# Patient Record
Sex: Female | Born: 1985 | Race: Asian | Hispanic: No | Marital: Married | Smoking: Never smoker
Health system: Southern US, Community
[De-identification: ages and names within clinical notes are randomized; demographics above are authoritative.]

## PROBLEM LIST (undated history)

## (undated) DIAGNOSIS — E119 Type 2 diabetes mellitus without complications: Secondary | ICD-10-CM

## (undated) DIAGNOSIS — R000 Tachycardia, unspecified: Secondary | ICD-10-CM

## (undated) HISTORY — PX: DILATION AND CURETTAGE OF UTERUS: SHX78

## (undated) HISTORY — DX: Tachycardia, unspecified: R00.0

---

## 2019-07-18 NOTE — Progress Notes (Signed)
Cardiology Office Note:    Date:  07/19/2019   ID:  Brittney Andrade, DOB 11-12-1985, MRN [id number]  PCP:  System, Pcp Not In  Cardiologist:  No primary care provider on file.  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  She has had 3 pregnancies.   During 3rd pregnancy in 07/2017 was feeling short of breath and had wheezing and LE edema.  Reports BNP elevated in clinic.  TTE was done which per her report showed normal EF.  She reports that her swelling resolved 2-3 months after delivery.  Took lasix 2-3 weeks after delivery.  Denies any chest pain, shortness of breath, or LE edema currently.  Went to ED 1 week post-partum with HR 150.  Work-up unremarkable.  She reports 3 weeks ago she was lying down and did not note any symptoms, but her watch recorded HR up to 151 bpm.    Past Medical History:  Diagnosis Date  . Tachycardia      Current Medications: Current Meds  Medication Sig  . Docusate Calcium (STOOL SOFTENER PO) Take 1 capsule by mouth daily as needed.  . Prenatal Vit-Fe Fumarate-FA (PRENATAL MULTIVITAMIN) TABS tablet Take 1 tablet by mouth daily at 12 noon.     Allergies:   Patient has no known allergies.   Social History   Socioeconomic History  . Marital status: Unknown    Spouse name: Not on file  . Number of children: Not on file  . Years of education: Not on file  . Highest education level: Not on file  Occupational History  . Not on file  Social Needs  . Financial resource strain: Not on file  . Food insecurity    Worry: Not on file    Inability: Not on file  . Transportation needs    Medical: Not on file    Non-medical: Not on file  Tobacco Use  . Smoking status: Not on file  Substance and Sexual Activity  . Alcohol use: Not on  file  . Drug use: Not on file  . Sexual activity: Not on file  Lifestyle  . Physical activity    Days per week: Not on file    Minutes per session: Not on file  . Stress: Not on file  Relationships  . Social Herbalist on phone: Not on file    Gets together: Not on file    Attends religious service: Not on file    Active member of club or organization: Not on file    Attends meetings of clubs or organizations: Not on file    Relationship status: Not on file  Other Topics Concern  . Not on file  Social History Narrative  . Not on file   3 pregnancies, 1 miscarriage  Family History: Mother: CAD, 5 stents Daughter: ?ASD  ROS:   Please see the history of present illness.    All other systems reviewed and are negative.  EKGs/Labs/Other Studies Reviewed:    The following studies were reviewed today:   EKG:  EKG is ordered today.  The ekg ordered today demonstrates NSR, rate 86 bpm, QTc 411, diffuse T wave flattening  Recent Labs: No results found for requested labs within last 8760 hours.  Recent Lipid Panel No  results found for: CHOL, TRIG, HDL, CHOLHDL, VLDL, LDLCALC, LDLDIRECT  Physical Exam:    VS:  BP 126/70   Pulse 86   Temp 98.6 F (37 C) (Temporal)   Ht 5\' 3"  (1.6 m)   Wt 191 lb 3.2 oz (86.7 kg)   SpO2 98%   BMI 33.87 kg/m     Wt Readings from Last 3 Encounters:  07/19/19 191 lb 3.2 oz (86.7 kg)     GEN:  Well nourished, well developed in no acute distress HEENT: Normal NECK: No JVD; No carotid bruits LYMPHATICS: No lymphadenopathy CARDIAC: RRR, no murmurs, rubs, gallops RESPIRATORY:  Clear to auscultation without rales, wheezing or rhonchi  ABDOMEN: Soft, non-tender, non-distended MUSCULOSKELETAL:  No edema; No deformity  SKIN: Warm and dry NEUROLOGIC:  Alert and oriented x 3 PSYCHIATRIC:  Normal affect   ASSESSMENT:    1. Peripartum cardiomyopathy   2. Tachycardia    PLAN:    In order of problems listed above:  Possible  peripartum cardiomyopathy: does not sound like this was actually diagnosed, as she reported dyspnea/edema during last pregnancy but reportedly TTE was normal.  Will obtain records from [facility] in [place], [place].  If normal TTE, then is not at increased risk with subsequent pregnancy.  She has no current symptoms to suggest heart failure, and declines repeating a TTE at this time.  Tachycardia: watch has twice recorded heart rates above 150, once in 2018 and again 3 weeks ago.  She was asymptomatic.  Given long interval between events, unlikely to capture on a monitor.  If having increased frequency then will evaluate with cardiac monitor   Medication Adjustments/Labs and Tests Ordered: Current medicines are reviewed at length with the patient today.  Concerns regarding medicines are outlined above.  No orders of the defined types were placed in this encounter.  No orders of the defined types were placed in this encounter.   Patient Instructions  Medication Instructions:  The current medical regimen is effective;  continue present plan and medications.  If you need a refill on your cardiac medications before your next appointment, please call your pharmacy.   Follow-Up: At [facility], you and your health needs are our priority.  As part of our continuing mission to provide you with exceptional heart care, we have created designated Provider Care Teams.  These Care Teams include your primary Cardiologist (physician) and Advanced Practice Providers (APPs -  Physician Assistants and Nurse Practitioners) who all work together to provide you with the care you need, when you need it. . Follow up with Dr.Rosamae Rocque as needed.  Any Other Special Instructions Will Be Listed Below (If Applicable). We will let you know if any changes once we receive records.      Signed, Brittney Ishikawahristopher L [name], MD  07/19/2019 2:49 PM    [facility]

## 2019-07-19 ENCOUNTER — Encounter: Payer: Self-pay | Admitting: Cardiology

## 2019-07-19 ENCOUNTER — Ambulatory Visit (INDEPENDENT_AMBULATORY_CARE_PROVIDER_SITE_OTHER): Payer: BC Managed Care – PPO | Admitting: Cardiology

## 2019-07-19 ENCOUNTER — Other Ambulatory Visit: Payer: Self-pay

## 2019-07-19 VITALS — BP 126/70 | HR 86 | Temp 98.6°F | Ht 63.0 in | Wt 191.2 lb

## 2019-07-19 DIAGNOSIS — O903 Peripartum cardiomyopathy: Secondary | ICD-10-CM

## 2019-07-19 DIAGNOSIS — R000 Tachycardia, unspecified: Secondary | ICD-10-CM

## 2019-07-19 NOTE — Patient Instructions (Signed)
Medication Instructions:  The current medical regimen is effective;  continue present plan and medications.  If you need a refill on your cardiac medications before your next appointment, please call your pharmacy.   Follow-Up: At [facility], you and your health needs are our priority.  As part of our continuing mission to provide you with exceptional heart care, we have created designated Provider Care Teams.  These Care Teams include your primary Cardiologist (physician) and Advanced Practice Providers (APPs -  Physician Assistants and Nurse Practitioners) who all work together to provide you with the care you need, when you need it. . Follow up with Dr.Schumann as needed.  Any Other Special Instructions Will Be Listed Below (If Applicable). We will let you know if any changes once we receive records.

## 2019-07-21 ENCOUNTER — Other Ambulatory Visit (INDEPENDENT_AMBULATORY_CARE_PROVIDER_SITE_OTHER): Payer: BC Managed Care – PPO

## 2019-07-21 DIAGNOSIS — R000 Tachycardia, unspecified: Secondary | ICD-10-CM | POA: Diagnosis not present

## 2019-10-24 ENCOUNTER — Telehealth: Payer: Self-pay | Admitting: Cardiology

## 2019-10-24 NOTE — Telephone Encounter (Signed)
New message    Call from patients OB/GYN following up on behalf of the patient who is currently pregnant.  According to 09/09 ov note [facility] was to obtain notes from [facility] in [place], [place]. Have the notes been obtained

## 2019-10-31 NOTE — Telephone Encounter (Signed)
[facility] was calling on behalf of the patient to follow up on a records request. She requested that her [facility] send records to Dr. Gardiner Rhyme. [place] states the patient filled out a record request at her appt in September.

## 2019-11-01 NOTE — Telephone Encounter (Signed)
Patient calling to follow up on the records request.

## 2019-11-10 NOTE — L&D Delivery Note (Signed)
Delivery Note She progressed to complete, AROM forebag with possible light meconium.  Due to rapid labor, no pain meds.  She initially did not push well due to pain, but then she suddenly pushed very well.  At 8:05 AM a viable female was delivered via Vaginal, Spontaneous (Presentation: Middle Occiput Anterior).  APGAR: 7, 8; weight pending.   Placenta status: Spontaneous, Intact.  Cord: 3 vessels with the following complications: None.  NICU team called due to baby retracting, started on CPAP and will go to NICU  Anesthesia: None Episiotomy: None Lacerations: 1st degree Suture Repair: 3.0 Monocryl Est. Blood Loss (mL):  150  Mom to postpartum.  Baby to NICU.  Brittney Andrade 04/07/2020, 8:43 AM

## 2020-02-07 ENCOUNTER — Other Ambulatory Visit: Payer: Self-pay

## 2020-02-07 ENCOUNTER — Encounter: Payer: BC Managed Care – PPO | Attending: Obstetrics and Gynecology | Admitting: Registered"

## 2020-02-07 DIAGNOSIS — O99810 Abnormal glucose complicating pregnancy: Secondary | ICD-10-CM | POA: Insufficient documentation

## 2020-02-08 ENCOUNTER — Encounter: Payer: Self-pay | Admitting: Registered"

## 2020-02-08 DIAGNOSIS — O99810 Abnormal glucose complicating pregnancy: Secondary | ICD-10-CM | POA: Insufficient documentation

## 2020-02-08 NOTE — Progress Notes (Signed)
Patient was seen on 02/07/20 for Gestational Diabetes self-management class at [facility]. The following learning objectives were met by the patient during this course:   States the definition of Gestational Diabetes  States why dietary management is important in controlling blood glucose  Describes the effects each nutrient has on blood glucose levels  Demonstrates ability to create a balanced meal plan  Demonstrates carbohydrate counting   States when to check blood glucose levels  Demonstrates proper blood glucose monitoring techniques  States the effect of stress and exercise on blood glucose levels  States the importance of limiting caffeine and abstaining from alcohol and smoking  Blood glucose monitor given: none Patient has meter and is already checking  Patient instructed to monitor glucose levels: FBS: 60 - <95; 1 hour: <140; 2 hour: <120  Patient received handouts:  Nutrition Diabetes and Pregnancy, including carb counting list  Patient will be seen for follow-up as needed.

## 2020-04-02 ENCOUNTER — Ambulatory Visit: Payer: BC Managed Care – PPO | Admitting: Physical Therapy

## 2020-04-07 ENCOUNTER — Inpatient Hospital Stay (HOSPITAL_COMMUNITY)
Admission: AD | Admit: 2020-04-07 | Discharge: 2020-04-09 | DRG: 807 | Disposition: A | Discharge status: Home / Self Care | Payer: BC Managed Care – PPO | Attending: Obstetrics and Gynecology | Admitting: Obstetrics and Gynecology

## 2020-04-07 ENCOUNTER — Encounter (HOSPITAL_COMMUNITY): Payer: Self-pay | Admitting: Obstetrics and Gynecology

## 2020-04-07 ENCOUNTER — Other Ambulatory Visit: Payer: Self-pay

## 2020-04-07 DIAGNOSIS — O24425 Gestational diabetes mellitus in childbirth, controlled by oral hypoglycemic drugs: Secondary | ICD-10-CM | POA: Diagnosis present

## 2020-04-07 DIAGNOSIS — Z3A38 38 weeks gestation of pregnancy: Secondary | ICD-10-CM

## 2020-04-07 DIAGNOSIS — O26893 Other specified pregnancy related conditions, third trimester: Secondary | ICD-10-CM | POA: Diagnosis present

## 2020-04-07 DIAGNOSIS — Z20822 Contact with and (suspected) exposure to covid-19: Secondary | ICD-10-CM | POA: Diagnosis present

## 2020-04-07 HISTORY — DX: Type 2 diabetes mellitus without complications: E11.9

## 2020-04-07 LAB — CBC
HCT: 35.5 % — ABNORMAL LOW (ref 36.0–46.0)
Hemoglobin: 11.6 g/dL — ABNORMAL LOW (ref 12.0–15.0)
MCH: 29.1 pg (ref 26.0–34.0)
MCHC: 32.7 g/dL (ref 30.0–36.0)
MCV: 89 fL (ref 80.0–100.0)
Platelets: 187 10*3/uL (ref 150–400)
RBC: 3.99 MIL/uL (ref 3.87–5.11)
RDW: 14.3 % (ref 11.5–15.5)
WBC: 14.7 10*3/uL — ABNORMAL HIGH (ref 4.0–10.5)
nRBC: 0 % (ref 0.0–0.2)

## 2020-04-07 LAB — TYPE AND SCREEN
ABO/RH(D): B POS
Antibody Screen: NEGATIVE

## 2020-04-07 LAB — RPR: RPR Ser Ql: NONREACTIVE

## 2020-04-07 LAB — ABO/RH: ABO/RH(D): B POS

## 2020-04-07 LAB — SARS CORONAVIRUS 2 BY RT PCR (HOSPITAL ORDER, PERFORMED IN ~~LOC~~ HOSPITAL LAB): SARS Coronavirus 2: NEGATIVE

## 2020-04-07 LAB — POCT FERN TEST: POCT Fern Test: POSITIVE

## 2020-04-07 MED ORDER — LACTATED RINGERS IV SOLN
INTRAVENOUS | Status: DC
  Filled 2020-04-07 (×2): qty 1000

## 2020-04-07 MED ORDER — ACETAMINOPHEN 325 MG PO TABS: 650.0000 mg | ORAL_TABLET | ORAL | Status: DC | PRN

## 2020-04-07 MED ORDER — COCONUT OIL OIL: 1.0000 "application " | TOPICAL_OIL | Status: DC | PRN

## 2020-04-07 MED ORDER — SENNOSIDES-DOCUSATE SODIUM 8.6-50 MG PO TABS
2.0000 | ORAL_TABLET | ORAL | Status: DC
  Administered 2020-04-07 – 2020-04-08 (×2): 2 via ORAL
  Filled 2020-04-07 (×2): qty 2

## 2020-04-07 MED ORDER — SIMETHICONE 80 MG PO CHEW: 80.0000 mg | CHEWABLE_TABLET | ORAL | Status: DC | PRN

## 2020-04-07 MED ORDER — PRENATAL MULTIVITAMIN CH
1.0000 | ORAL_TABLET | Freq: Every day | ORAL | Status: DC
  Administered 2020-04-08: 1 via ORAL
  Filled 2020-04-07: qty 1

## 2020-04-07 MED ORDER — MEASLES, MUMPS & RUBELLA VAC IJ SOLR: 0.5000 mL | Freq: Once | INTRAMUSCULAR | Status: DC

## 2020-04-07 MED ORDER — TETANUS-DIPHTH-ACELL PERTUSSIS 5-2.5-18.5 LF-MCG/0.5 IM SUSP: 0.5000 mL | Freq: Once | INTRAMUSCULAR | Status: DC

## 2020-04-07 MED ORDER — OXYCODONE HCL 5 MG PO TABS: 5.0000 mg | ORAL_TABLET | ORAL | Status: DC | PRN

## 2020-04-07 MED ORDER — OXYTOCIN 40 UNITS IN NORMAL SALINE INFUSION - SIMPLE MED
2.5000 [IU]/h | INTRAVENOUS | Status: DC
  Administered 2020-04-07: 2.5 [IU]/h via INTRAVENOUS
  Filled 2020-04-07: qty 1000

## 2020-04-07 MED ORDER — WITCH HAZEL-GLYCERIN EX PADS: 1.0000 "application " | MEDICATED_PAD | CUTANEOUS | Status: DC | PRN

## 2020-04-07 MED ORDER — METHYLERGONOVINE MALEATE 0.2 MG/ML IJ SOLN: 0.2000 mg | INTRAMUSCULAR | Status: DC | PRN

## 2020-04-07 MED ORDER — METHYLERGONOVINE MALEATE 0.2 MG PO TABS
0.2000 mg | ORAL_TABLET | ORAL | Status: DC | PRN
  Filled 2020-04-07: qty 1

## 2020-04-07 MED ORDER — IBUPROFEN 600 MG PO TABS
600.0000 mg | ORAL_TABLET | Freq: Four times a day (QID) | ORAL | Status: DC
  Administered 2020-04-07 – 2020-04-09 (×7): 600 mg via ORAL
  Filled 2020-04-07 (×8): qty 1

## 2020-04-07 MED ORDER — ONDANSETRON HCL 4 MG/2ML IJ SOLN: 4.0000 mg | Freq: Four times a day (QID) | INTRAMUSCULAR | Status: DC | PRN

## 2020-04-07 MED ORDER — LIDOCAINE HCL (PF) 1 % IJ SOLN
INTRAMUSCULAR | Status: AC
  Administered 2020-04-07: 30 mL
  Filled 2020-04-07: qty 30

## 2020-04-07 MED ORDER — LACTATED RINGERS IV SOLN
500.0000 mL | INTRAVENOUS | Status: DC | PRN
  Filled 2020-04-07: qty 1000

## 2020-04-07 MED ORDER — ZOLPIDEM TARTRATE 5 MG PO TABS: 5.0000 mg | ORAL_TABLET | Freq: Every evening | ORAL | Status: DC | PRN

## 2020-04-07 MED ORDER — OXYTOCIN BOLUS FROM INFUSION
500.0000 mL | Freq: Once | INTRAVENOUS | Status: AC
  Administered 2020-04-07: 500 mL via INTRAVENOUS

## 2020-04-07 MED ORDER — DIPHENHYDRAMINE HCL 25 MG PO CAPS: 25.0000 mg | ORAL_CAPSULE | Freq: Four times a day (QID) | ORAL | Status: DC | PRN

## 2020-04-07 MED ORDER — SOD CITRATE-CITRIC ACID 500-334 MG/5ML PO SOLN: 30.0000 mL | ORAL | Status: DC | PRN

## 2020-04-07 MED ORDER — OXYCODONE-ACETAMINOPHEN 5-325 MG PO TABS: 1.0000 | ORAL_TABLET | ORAL | Status: DC | PRN

## 2020-04-07 MED ORDER — MAGNESIUM HYDROXIDE 400 MG/5ML PO SUSP: 30.0000 mL | ORAL | Status: DC | PRN

## 2020-04-07 MED ORDER — OXYCODONE-ACETAMINOPHEN 5-325 MG PO TABS: 2.0000 | ORAL_TABLET | ORAL | Status: DC | PRN

## 2020-04-07 MED ORDER — OXYCODONE HCL 5 MG PO TABS: 10.0000 mg | ORAL_TABLET | ORAL | Status: DC | PRN

## 2020-04-07 MED ORDER — ONDANSETRON HCL 4 MG PO TABS: 4.0000 mg | ORAL_TABLET | ORAL | Status: DC | PRN

## 2020-04-07 MED ORDER — ONDANSETRON HCL 4 MG/2ML IJ SOLN: 4.0000 mg | INTRAMUSCULAR | Status: DC | PRN

## 2020-04-07 MED ORDER — LIDOCAINE HCL (PF) 1 % IJ SOLN
30.0000 mL | INTRAMUSCULAR | Status: DC | PRN
  Filled 2020-04-07: qty 30

## 2020-04-07 MED ORDER — BENZOCAINE-MENTHOL 20-0.5 % EX AERO
1.0000 "application " | INHALATION_SPRAY | CUTANEOUS | Status: DC | PRN
  Administered 2020-04-07: 1 via TOPICAL
  Filled 2020-04-07: qty 56

## 2020-04-07 MED ORDER — DIBUCAINE (PERIANAL) 1 % EX OINT: 1.0000 "application " | TOPICAL_OINTMENT | CUTANEOUS | Status: DC | PRN

## 2020-04-07 MED ORDER — FLEET ENEMA 7-19 GM/118ML RE ENEM
1.0000 | ENEMA | RECTAL | Status: DC | PRN
  Filled 2020-04-07: qty 1

## 2020-04-07 NOTE — Lactation Note (Signed)
This note was copied from a baby's chart. Lactation Consultation Note  Patient Name: Brittney Andrade [id number] Date: 04/07/2020 Reason for consult: Initial assessment;Early term 67-38.6wks  P3 mother whose infant is now 80 hours old.  This is an ETI at 38+1 weeks admitted to the NICU for respiratory distress.  Mother breast fed her first child for 2 years and her second child for 2 1/2 years and has recently stopped breast feeding the second child.   Baby had just been placed STS on mother's chest when I arrived. Baby is on CPAP and not currently feeding.  RN had initiated the DEBP.  Mother is familiar with hand expression and did not wish to review.  She also needed no further assistance with the DEBP.  Suggested she continue to practice hand expression before/after pumping to help stimulate her breasts.  Asked her to pump every three hours with one duration of four hours during the night if desired.  Mother will save any EBM she obtains with hand expression and pumping.    RN had mentioned that the #24 flange size may be too small for mother.  I did not physically assess at this time due to baby just being placed STS on mother.  I discussed how to observe if the flange size is correct and mother feels comfortable assessing her flange size.  Asked her to call back if she has any concerns about the correct size to use.  She is also familiar with the manual pump that is included with her kit.    Provided NICU booklet, "Providing Breast Milk For Your Baby in the NICU" to mother and the lactation brochure.  Mother has a DEBP for home use.  Encouraged to call for any questions/concerns.  RN updated.   Maternal Data Formula Feeding for Exclusion: No Has patient been taught Hand Expression?: Yes Does the patient have breastfeeding experience prior to this delivery?: Yes  Feeding    LATCH Score                   Interventions    Lactation Tools Discussed/Used Pump Review: (No  review needed)   Consult Status Consult Status: Follow-up Date: 04/08/20 Follow-up type: In-patient    Little Ishikawa 04/07/2020, 5:32 PM

## 2020-04-07 NOTE — MAU Note (Signed)
Brittney Andrade is a 34 y.o. at [redacted]w[redacted]d here in MAU reporting:  +contractions Onset of complaint: started last night but intensified around 5am Denies LOF. +bloody show +nausea +dizziness Pain score: 7/10 Vitals:   04/07/20 0732  BP: 126/72  Pulse: 85  Resp: 17  Temp: 98.5 F (36.9 C)  SpO2: 99%    +FM Lab orders placed from triage: mau labor triage

## 2020-04-07 NOTE — H&P (Signed)
Brittney Andrade is a 34 y.o. female, G4 P52, EGA [redacted] weeks with EDC 6-12 presenting for ctx.  On eval in MAU, VE 9+ cm, had SROM.  Prenatal care complicated by A2GDM controlled with Metformin 500 mg po qhs, reactive NSTs.  She has h/o postpartum CHF/fluid overload after last delivery.  OB History    Gravida  4   Para  2   Term  2   Preterm      AB  1   Living        SAB  1   TAB      Ectopic      Multiple      Live Births             Past Medical History:  Diagnosis Date  . Diabetes mellitus without complication (HCC)    gestational DM  . Tachycardia    Past Surgical History:  Procedure Laterality Date  . DILATION AND CURETTAGE OF UTERUS     Family History: family history is not on file. Social History:  reports that she has never smoked. She does not have any smokeless tobacco history on file. She reports that she does not drink alcohol or use drugs.     Maternal Diabetes: Yes:  Diabetes Type:  Insulin/Medication controlled Genetic Screening: Declined Maternal Ultrasounds/Referrals: Normal Fetal Ultrasounds or other Referrals:  None Maternal Substance Abuse:  No Significant Maternal Medications:  Meds include: Other:  Significant Maternal Lab Results:  Group B Strep negative Other Comments:  Metformin  Review of Systems  Respiratory: Negative.   Cardiovascular: Negative.    Maternal Medical History:  Reason for admission: Contractions.   Contractions: Frequency: regular.   Perceived severity is strong.    Fetal activity: Perceived fetal activity is normal.    Prenatal Complications - Diabetes: gestational. Diabetes is managed by oral agent (monotherapy).      Dilation: 10 Effacement (%): 100 Station: -2, -1 Exam by:: Rickesha Veracruz, MD Blood pressure (!) 108/36, pulse (!) 102, temperature 98.5 F (36.9 C), temperature source Oral, resp. rate 17, height 5\' 3"  (1.6 m), weight 102.5 kg, SpO2 99 %. Maternal Exam:  Uterine Assessment: Contraction  strength is moderate.  Contraction frequency is regular.   Abdomen: Patient reports no abdominal tenderness. Estimated fetal weight is 7 lbs.   Fetal presentation: vertex  Introitus: Normal vulva. Normal vagina.  Amniotic fluid character: clear.  Pelvis: adequate for delivery.      Physical Exam  Vitals reviewed. Constitutional: She appears well-developed and well-nourished.  Cardiovascular: Normal rate and regular rhythm.  Respiratory: Effort normal. No respiratory distress.  GI: Soft.  Genitourinary:    Vulva normal.     Prenatal labs: ABO, Rh:  B pos Antibody:  neg Rubella:  Immune RPR:   NR HBsAg:  neg  HIV:   NR GBS:   Neg  Assessment/Plan: IUP at 38 weeks, A2GDM controlled with Metformin, admitted in active labor.  See delivery note   04/07/2020, 8:38 AM

## 2020-04-07 NOTE — MAU Note (Signed)
Patient c/o a "pop". States that her water broke. Fern slide obtained.

## 2020-04-07 NOTE — Progress Notes (Signed)
Due to infant's inability to breastfeed yet, RN set patient up with DEBP.  RN educated on frequency of pumping and how to use and clean the pump.  RN also educated patient on hand expression.  The patient voiced understanding and began pumping.    Brittney Andrade, [place]

## 2020-04-08 NOTE — Lactation Note (Signed)
This note was copied from a baby's chart. Lactation Consultation Note  Patient Name: Brittney Andrade [id number] Date: 04/08/2020 Reason for consult: Follow-up assessment;NICU baby;Maternal endocrine disorder Type of Endocrine Disorder?: Diabetes   RN called out for latch assistance.  Infant crying at breast.  LC encouraged mom to placed infant STS.  Mom BF previous children 2.5 years recently weaned the 46.33 year old.    Mom used a NS with previous child temporarily.    LC has infant suck gloved finger with colostrum.  Infant tongue thrusted and sucked her tongue.  Rhythmic sucking began, then infant was placed to breast but fell asleep and would not suck.  Different positions attempted and both breasts tried.  NS 24 placed.  Mom is able to apply correctly.  Infant opened slightly and took a few shallow sucks then fell asleep.  Mom was placed in laid back position to attempt to bf but infant would not wake.  Football and cross cradle were also tried.  Infant would suck finger but would not open and latch to feed.    Mom states she pumped but didn't get much so she isn't pumping.  LC strongly encouraged mom to pump now and continue to pump every two to three hours to stimulate milk supply even if she is not seeing volume to collect.  She was encouraged to hand express after pumping or breastfeeding.  Mom will attempt to put infant to breast when infant shoes cues.  BF basics reviewed with mom.     Maternal Data Has patient been taught Hand Expression?: Yes Does the patient have breastfeeding experience prior to this delivery?: Yes  Feeding Feeding Type: Breast Fed  LATCH Score Latch: Too sleepy or reluctant, no latch achieved, no sucking elicited.  Audible Swallowing: None  Type of Nipple: Everted at rest and after stimulation  Comfort (Breast/Nipple): Soft / non-tender  Hold (Positioning): Assistance needed to correctly position infant at breast and maintain latch.  LATCH Score:  5  Interventions Interventions: Breast feeding basics reviewed;Assisted with latch;Skin to skin;Adjust position;Position options;DEBP  Lactation Tools Discussed/Used Tools: Nipple Shields Nipple shield size: 24   Consult Status Consult Status: Follow-up Date: 04/09/20 Follow-up type: In-patient    Maryruth Hancock [facility] 04/08/2020, 11:29 AM

## 2020-04-08 NOTE — Lactation Note (Signed)
This note was copied from a baby's chart. Lactation Consultation Note  Patient Name: Girl Adler Alton [id number] Date: 04/08/2020 Reason for consult: Follow-up assessment;NICU baby;Maternal endocrine disorder Type of Endocrine Disorder?: Diabetes Mom breastfeeding on arrival.  Infant takes a few sucks and pauses.  Unable to see positioning well do to mom and baby's position. But from what can see appears lacthed well.  Discussed trying 5 french as the breast with syringe to assist infant in getting more volume.  Mom reports she feels she is doing good right now this is the first time she has latched so may try later.  Mom has breastpump for home use.Urged mom to call lactation as needed.   Maternal Data Has patient been taught Hand Expression?: Yes Does the patient have breastfeeding experience prior to this delivery?: Yes  Feeding Feeding Type: Breast Fed  LATCH Score Latch: Grasps breast easily, tongue down, lips flanged, rhythmical sucking.  Audible Swallowing: Spontaneous and intermittent  Type of Nipple: Everted at rest and after stimulation  Comfort (Breast/Nipple): Soft / non-tender  Hold (Positioning): No assistance needed to correctly position infant at breast.  LATCH Score: 10  Interventions Interventions: Breast feeding basics reviewed;Assisted with latch;Skin to skin;Adjust position;Position options;DEBP  Lactation Tools Discussed/Used Tools: Nipple Shields Nipple shield size: 24   Consult Status Consult Status: Follow-up Date: 04/09/20 Follow-up type: In-patient    Hope Michaelle Copas 04/08/2020, 1:54 PM

## 2020-04-08 NOTE — Progress Notes (Signed)
Patient screened out for psychosocial assessment since none of the following apply: °Psychosocial stressors documented in mother or baby's chart °Gestation less than 32 weeks °Code at delivery  °Infant with anomalies °Please contact the Clinical Social Worker if specific needs arise, by MOB's request, or if MOB scores greater than 9/yes to question 10 on Edinburgh Postpartum Depression Screen. ° °Enmanuel Zufall Boyd-Gilyard, MSW, LCSW °Clinical Social Work °[phone] °  °

## 2020-04-08 NOTE — Progress Notes (Signed)
Post Partum Day 1 Subjective: no complaints, up ad lib, voiding, tolerating PO, + flatus and lochia mild. She denies HA, SOB, CP or lightheadedness. She is bonding well with baby; breastfeeding.  Objective: Blood pressure 121/65, pulse 73, temperature 98.1 F (36.7 C), temperature source Oral, resp. rate 16, height 5\' 3"  (1.6 m), weight 102.5 kg, SpO2 100 %, unknown if currently breastfeeding.  Physical Exam:  General: alert, cooperative and no distress Lochia: appropriate Uterine Fundus: firm Incision: n/a DVT Evaluation: No evidence of DVT seen on physical exam. No significant calf/ankle edema.  Recent Labs    04/07/20 0842  HGB 11.6*  HCT 35.5*    Assessment/Plan: Plan for discharge tomorrow  Routine pp care    LOS: 1 day   Brittney Andrade 04/08/2020, 9:13 AM

## 2020-04-09 ENCOUNTER — Ambulatory Visit: Payer: Self-pay

## 2020-04-09 MED ORDER — PRENATAL MULTIVITAMIN CH: 1.0000 | ORAL_TABLET | Freq: Every day | ORAL | 3 refills | Status: AC

## 2020-04-09 MED ORDER — IBUPROFEN 600 MG PO TABS: 600.0000 mg | ORAL_TABLET | Freq: Four times a day (QID) | ORAL | 1 refills | Status: AC

## 2020-04-09 NOTE — Lactation Note (Signed)
This note was copied from a baby's chart. Lactation Consultation Note  Patient Name: Brittney Andrade [id number] Date: 04/09/2020 Reason for consult: Follow-up assessment;Difficult latch;NICU baby;Early term 37-38.6wks Type of Endocrine Disorder?: Diabetes  LC in to visit with P3 Mom of infant in the NICU.  Baby is 69 hrs old.  IV DC'd this am.  Mom states baby latched at 9 am feeding and fed on and off.    Mom using cradle hold and trying to latch baby to the breast when LC came in room.  Unsnapped Mom's gown to provide for STS.  Assisted Mom to use cross cradle hold to latch baby.  Baby stiffening and arching a bit away from breast.  Mom trying to push nipple into baby's mouth.  Mom leaning into baby.  Readjusted Mom more upright.  Initiated a 24 mm nipple shield, as Mom's nipples are large in diameter.  Baby can open her mouth widely, but unable to sustain latch.  Baby had no interest in latching to breast at this time.  Hand expressed colostrum onto nipple and baby lips.  Baby didn't root.  Placed baby STS on Mom's chest and baby immediately fell asleep.   Baby was fed 24 ml formula at 0915.  Recommended Mom wait another hour with baby STS and see if she cues.  To try at breast again and then supplement in unable to latch to breast.  Colostrum containers provided and encouraged Mom to hand express for baby.  Disassembled pump parts, washed, rinsed and placed in separate container for drying.   Interventions Interventions: Breast feeding basics reviewed;Skin to skin;Hand express;Breast massage;Pre-pump if needed;Adjust position;Support pillows;Position options;Assisted with latch;DEBP;Hand pump  Lactation Tools Discussed/Used Tools: Nipple Dorris Carnes;Flanges;Pump Nipple shield size: 24 Flange Size: 27 Breast pump type: Double-Electric Breast Pump   Consult Status Consult Status: Follow-up Date: 04/10/20 Follow-up type: In-patient    [name] 04/09/2020, 12:19 PM

## 2020-04-09 NOTE — Lactation Note (Signed)
This note was copied from a baby's chart. Lactation Consultation Note  Patient Name: Girl Zanae Kuehnle [id number]'Y Date: 04/09/2020 Reason for consult: Follow-up assessment;Difficult latch;NICU baby;Early term 37-38.6wks Type of Endocrine Disorder?: Diabetes  RN called saying baby was cueing.  Baby unable to attain a deep latch to breast, with consistent suck/swallow pattern.  Baby able to latch fairly well without a nipple shield, but takes a couple sucks and then falls asleep.  Colostrum hand expressed to entice baby, and gently tugged on chin to open mouth wider.  Baby would take a couple sucks and then stop.  Assisted Mom with cross cradle, laid back, and football holds, using nipple shield and without.  Nipple pulled well into shield, introduced 5 fr feeding tube under shield.  Baby unable to sustain suck pattern at the breast.  After trying for about 15-20 mins, Mom finger fed 12 ml of formula using 5 fr.feeding tube and syringe.  Baby relaxed.  Encouraged Mom to double pump, hand express after and then keep baby STS on her chest until she cues again.   Mom aware of OP lactation support after discharge, encouraged her to have an appt with Noralee Stain RN IBCLC next week.   Interventions Interventions: Breast feeding basics reviewed;Skin to skin;Hand express;Breast massage;Pre-pump if needed;Adjust position;Support pillows;Position options;Assisted with latch;DEBP;Hand pump  Lactation Tools Discussed/Used Tools: Pump;Nipple Shields Nipple shield size: 24 Flange Size: 27 Breast pump type: Double-Electric Breast Pump   Consult Status Consult Status: Follow-up Date: 04/10/20 Follow-up type: In-patient    [name] 04/09/2020, 2:25 PM

## 2020-04-09 NOTE — Lactation Note (Addendum)
This note was copied from a baby's chart. Lactation Consultation Note  Patient Name: Brittney Andrade PZWCH'E Date: 04/09/2020 Reason for consult: Follow-up assessment;NICU baby   Wonda Horner RN called stating she is concerned about latch depth. Baby is now 77 hours old and has ad lib order for breastfeeding. [facility] visited w/ mother briefly.  Suggest mother call for lactation when baby is ready to latch for assistance. Mother eating breakfast. Mother is Ex BF and recently stopped bf 105.33 year old. She states she is pumping q 4 hours but pumped last at 2am and mother states she is pumping no volume.  She states flange size is comfortable.  Recommend pumping 2-2.5 hours during the day and q 4 hours at night. Mother has DEBP at home.  Lactation to follow up when mother calls.     Maternal Data    Feeding Feeding Type: Bottle Fed - Formula Nipple Type: Nfant Extra Slow Flow (gold)  LATCH Score Latch: Repeated attempts needed to sustain latch, nipple held in mouth throughout feeding, stimulation needed to elicit sucking reflex.  Audible Swallowing: A few with stimulation  Type of Nipple: Everted at rest and after stimulation  Comfort (Breast/Nipple): Soft / non-tender  Hold (Positioning): Assistance needed to correctly position infant at breast and maintain latch.  LATCH Score: 7  Interventions Interventions: DEBP  Lactation Tools Discussed/Used     Consult Status Consult Status: Follow-up Date: 04/09/20 Follow-up type: In-patient    Dahlia Byes [facility] 04/09/2020, 10:19 AM

## 2020-04-09 NOTE — Progress Notes (Signed)
Post Partum Day 2 Subjective: no complaints, up ad lib, voiding, tolerating PO and nl lochia, pain controlled  Objective: Blood pressure 117/75, pulse 69, temperature 98.7 F (37.1 C), temperature source Oral, resp. rate 14, height 5\' 3"  (1.6 m), weight 102.5 kg, SpO2 99 %, unknown if currently breastfeeding.  Physical Exam:  General: alert and no distress Lochia: appropriate Uterine Fundus: firm  Recent Labs    04/07/20 0842  HGB 11.6*  HCT 35.5*    Assessment/Plan: Discharge home, Breastfeeding and Lactation consult.  Baby in NICU.  D/C with Motrin and PNV.  F/u 6 weeks.  2hr GTT in PP period.     LOS: 2 days   Brittney Andrade 04/09/2020, 9:10 AM

## 2020-04-09 NOTE — Discharge Summary (Addendum)
Postpartum Discharge Summary       Patient Name: Brittney Andrade DOB: 09-07-1986 MRN: [id number]  Date of admission: 04/07/2020 Delivery date:04/07/2020  Delivering provider: Willis Modena, TODD  Date of discharge: 04/09/2020  Admitting diagnosis: Normal labor and delivery [O80] Intrauterine pregnancy: [redacted]w[redacted]d    Secondary diagnosis:  Active Problems:   SVD (spontaneous vaginal delivery)  Additional problems: A2DM, precipitous delivery, h/o CHF after delivery    Discharge diagnosis: Term Pregnancy Delivered                                              Post partum procedures:N/A Augmentation: N/A Complications: None  Hospital course: Onset of Labor With Vaginal Delivery      34y.o. yo [id number] admitted in Active Labor on 04/07/2020. Patient had an uncomplicated labor course as follows:  Membrane Rupture Time/Date: 7:35 AM ,04/07/2020   Delivery Method:Vaginal, Spontaneous  Episiotomy: None  Lacerations:  1st degree  Patient had an uncomplicated postpartum course.  She is ambulating, tolerating a regular diet, passing flatus, and urinating well. Patient is discharged home in stable condition on 04/09/20.  Newborn Data: Birth date:04/07/2020  Birth time:8:05 AM  Gender:Female  Living status:Living  Apgars:7 ,8  Weight:3040 g   Magnesium Sulfate received: No BMZ received: No Rhophylac:No MMR:No T-DaP:Given prenatally Flu: N/A Transfusion:No  Physical exam  Vitals:   04/08/20 0500 04/08/20 1500 04/08/20 2320 04/09/20 0613  BP: 121/65 133/68 118/68 117/75  Pulse: 73 67 70 69  Resp: 16 20 18 14   Temp: 98.1 F (36.7 C) 98.5 F (36.9 C) 99.1 F (37.3 C) 98.7 F (37.1 C)  TempSrc: Oral  Oral Oral  SpO2: 100%  98% 99%  Weight:      Height:       General: alert and no distress Lochia: appropriate Uterine Fundus: firm  Labs: Lab Results  Component Value Date   WBC 14.7 (H) 04/07/2020   HGB 11.6 (L) 04/07/2020   HCT 35.5 (L) 04/07/2020   MCV 89.0  04/07/2020   PLT 187 04/07/2020   No flowsheet data found. Edinburgh Score: Edinburgh Postnatal Depression Scale Screening Tool 04/07/2020  I have been able to laugh and see the funny side of things. 0  I have looked forward with enjoyment to things. 0  I have blamed myself unnecessarily when things went wrong. 0  I have been anxious or worried for no good reason. 0  I have felt scared or panicky for no good reason. 1  Things have been getting on top of me. 1  I have been so unhappy that I have had difficulty sleeping. 0  I have felt sad or miserable. 0  I have been so unhappy that I have been crying. 1  The thought of harming myself has occurred to me. 0  Edinburgh Postnatal Depression Scale Total 3     After visit meds:  Allergies as of 04/09/2020   No Known Allergies     Medication List    STOP taking these medications   metFORMIN 500 MG tablet Commonly known as: GLUCOPHAGE     TAKE these medications   ibuprofen 600 MG tablet Commonly known as: ADVIL Take 1 tablet (600 mg total) by mouth every 6 (six) hours.   prenatal multivitamin Tabs tablet Take 1 tablet by mouth daily at 12 noon.   STOOL  SOFTENER PO Take 1 capsule by mouth daily as needed.        Discharge home in stable condition Infant Feeding: Breast Infant Disposition:NICU Discharge instruction: per After Visit Summary and Postpartum booklet. Activity: Advance as tolerated. Pelvic rest for 6 weeks.  Diet: routine diet Future Appointments:No future appointments. Follow up Visit: Follow-up Information    Meisinger, Todd, MD. Schedule an appointment as soon as possible for a visit in 6 week(s).   Specialty: Obstetrics and Gynecology Why: for postpartum check-up Contact information: [address], [address] [place] [address] [phone]            Please schedule this patient for a In person postpartum visit in 1 and 6 weeks with the following provider: MD. Additional Postpartum  F/U:N/A  Low risk pregnancy complicated by: h/o CHF and fluid overload, A2DM Delivery mode:  Vaginal, Spontaneous  Anticipated Birth Control:  Unsure   04/09/2020 Janyth Contes, MD

## 2020-04-10 ENCOUNTER — Ambulatory Visit: Payer: Self-pay

## 2020-04-10 ENCOUNTER — Ambulatory Visit: Payer: BC Managed Care – PPO

## 2020-04-10 NOTE — Lactation Note (Signed)
This note was copied from a baby's chart. Lactation Consultation Note  Patient Name: Brittney Andrade IDPOE'U Date: 04/10/2020 Reason for consult: Follow-up assessment   Mother is a P3, infant is 14 hours old .  Mother reports that she was able to pump 5 plus ml in the colostrum vial this am. She feels that today is day 4 and she usually starts making milk by day 4.  . Mother has a DEBP sat up at the bedside and a Spectra pump at home. Mother reports that she attempt this am and infant refused to bare nipple as well as the nipple shield when attempting to breast feed.   Encouraged mother to continue to keep trying to offer breast first before infant gets too over stimulated.   Discussed treatment and prevention of engorgement. Discussed S/S of Mastitis. Mother has two other small children at home.   Plan of Care : Breastfeed infant with feeding cues Supplement infant with ebm/formula, according to supplemental guidelines. Pump using a DEBP after each feeding for 15-20 mins.   Mother to continue to cue base feed infant and feed at least 8-12 times or more in 24 hours and advised to allow for cluster feeding infant as needed.   Mother to page piror to discharge if she would like assistance with next feeding.  Mother to continue to due STS. Mother is aware of available LC services at [facility], BFSG'S, OP Dept, and phone # for questions or concerns about breastfeeding.  Mother receptive to all teaching and plan of care.    Maternal Data    Feeding Feeding Type: Formula Nipple Type: Nfant Extra Slow Flow (gold)  LATCH Score Latch: Repeated attempts needed to sustain latch, nipple held in mouth throughout feeding, stimulation needed to elicit sucking reflex.  Audible Swallowing: A few with stimulation  Type of Nipple: Everted at rest and after stimulation  Comfort (Breast/Nipple): Soft / non-tender  Hold (Positioning): Assistance needed to correctly position infant at breast and  maintain latch.  LATCH Score: 7  Interventions Interventions: Breast massage;Ice  Lactation Tools Discussed/Used     Consult Status Consult Status: Complete    Brittney Andrade 04/10/2020, 8:59 AM

## 2020-04-14 ENCOUNTER — Inpatient Hospital Stay (HOSPITAL_COMMUNITY)
Admission: AD | Admit: 2020-04-14 | Payer: BC Managed Care – PPO | Source: Home / Self Care | Admitting: Obstetrics and Gynecology

## 2020-04-14 ENCOUNTER — Inpatient Hospital Stay (HOSPITAL_COMMUNITY): Payer: BC Managed Care – PPO

## 2020-04-16 ENCOUNTER — Inpatient Hospital Stay (HOSPITAL_COMMUNITY)
Admission: AD | Admit: 2020-04-16 | Discharge: 2020-04-16 | Disposition: A | Discharge status: Home / Self Care | Payer: BC Managed Care – PPO | Source: Ambulatory Visit | Attending: Obstetrics and Gynecology | Admitting: Obstetrics and Gynecology

## 2020-04-16 ENCOUNTER — Other Ambulatory Visit: Payer: Self-pay

## 2020-04-16 DIAGNOSIS — M542 Cervicalgia: Secondary | ICD-10-CM | POA: Diagnosis not present

## 2020-04-16 DIAGNOSIS — O99893 Other specified diseases and conditions complicating puerperium: Secondary | ICD-10-CM | POA: Insufficient documentation

## 2020-04-16 DIAGNOSIS — O135 Gestational [pregnancy-induced] hypertension without significant proteinuria, complicating the puerperium: Secondary | ICD-10-CM | POA: Diagnosis present

## 2020-04-16 DIAGNOSIS — R519 Headache, unspecified: Secondary | ICD-10-CM | POA: Insufficient documentation

## 2020-04-16 DIAGNOSIS — M546 Pain in thoracic spine: Secondary | ICD-10-CM | POA: Insufficient documentation

## 2020-04-16 DIAGNOSIS — O139 Gestational [pregnancy-induced] hypertension without significant proteinuria, unspecified trimester: Secondary | ICD-10-CM

## 2020-04-16 DIAGNOSIS — E119 Type 2 diabetes mellitus without complications: Secondary | ICD-10-CM | POA: Insufficient documentation

## 2020-04-16 DIAGNOSIS — M7989 Other specified soft tissue disorders: Secondary | ICD-10-CM | POA: Diagnosis not present

## 2020-04-16 LAB — COMPREHENSIVE METABOLIC PANEL
ALT: 22 U/L (ref 0–44)
AST: 16 U/L (ref 15–41)
Albumin: 2.8 g/dL — ABNORMAL LOW (ref 3.5–5.0)
Alkaline Phosphatase: 90 U/L (ref 38–126)
Anion gap: 10 (ref 5–15)
BUN: 11 mg/dL (ref 6–20)
CO2: 25 mmol/L (ref 22–32)
Calcium: 8.3 mg/dL — ABNORMAL LOW (ref 8.9–10.3)
Chloride: 105 mmol/L (ref 98–111)
Creatinine, Ser: 0.66 mg/dL (ref 0.44–1.00)
GFR calc Af Amer: >60 mL/min (ref >60)
GFR calc non Af Amer: >60 mL/min (ref >60)
Glucose, Bld: 105 mg/dL — ABNORMAL HIGH (ref 70–99)
Potassium: 4.2 mmol/L (ref 3.5–5.1)
Sodium: 140 mmol/L (ref 135–145)
Total Bilirubin: 0.4 mg/dL (ref 0.3–1.2)
Total Protein: 5.9 g/dL — ABNORMAL LOW (ref 6.5–8.1)

## 2020-04-16 LAB — CBC
HCT: 31.3 % — ABNORMAL LOW (ref 36.0–46.0)
Hemoglobin: 10.1 g/dL — ABNORMAL LOW (ref 12.0–15.0)
MCH: 28.7 pg (ref 26.0–34.0)
MCHC: 32.3 g/dL (ref 30.0–36.0)
MCV: 88.9 fL (ref 80.0–100.0)
Platelets: 265 10*3/uL (ref 150–400)
RBC: 3.52 MIL/uL — ABNORMAL LOW (ref 3.87–5.11)
RDW: 13.9 % (ref 11.5–15.5)
WBC: 10.1 10*3/uL (ref 4.0–10.5)
nRBC: 0 % (ref 0.0–0.2)

## 2020-04-16 LAB — PROTEIN / CREATININE RATIO, URINE
Creatinine, Urine: 28.18 mg/dL
Total Protein, Urine: <6 mg/dL

## 2020-04-16 MED ORDER — ENALAPRIL MALEATE 10 MG PO TABS
10.0000 mg | ORAL_TABLET | Freq: Every day | ORAL | 1 refills | Status: DC
Start: 2020-04-16 — End: 2020-05-01

## 2020-04-16 NOTE — MAU Note (Signed)
Vag 5/30.  Having headache and neck pain, started yesterday.  Taken Ibuprofen and Tylenol, helped for a few hrs, then returned.  Started with generalized weakness, body aches, and pain between shoulder blades/behind her arms. Also pain from engorgement (pumping). C/o swelling in her hands, face and feet.  (mostly in her fingers).  154/102 yesterday, 147/77 this afternoon.  No HTN with preg.

## 2020-04-16 NOTE — Discharge Instructions (Signed)
Postpartum Hypertension Postpartum hypertension is high blood pressure that remains higher than normal after childbirth. You may not realize that you have postpartum hypertension if your blood pressure is not being checked regularly. In most cases, postpartum hypertension will go away on its own, usually within a week of delivery. However, for some women, medical treatment is required to prevent serious complications, such as seizures or stroke. What are the causes? This condition may be caused by one or more of the following:  Hypertension that existed before pregnancy (chronic hypertension).  Hypertension that comes on as a result of pregnancy (gestational hypertension).  Hypertensive disorders during pregnancy (preeclampsia) or seizures in women who have high blood pressure during pregnancy (eclampsia).  A condition in which the liver, platelets, and red blood cells are damaged during pregnancy (HELLP syndrome).  A condition in which the thyroid produces too much hormones (hyperthyroidism).  Other rare problems of the nerves (neurological disorders) or blood disorders. In some cases, the cause may not be known. What increases the risk? The following factors may make you more likely to develop this condition:  Chronic hypertension. In some cases, this may not have been diagnosed before pregnancy.  Obesity.  Type 2 diabetes.  Kidney disease.  History of preeclampsia or eclampsia.  Other medical conditions that change the level of hormones in the body (hormonal imbalance). What are the signs or symptoms? As with all types of hypertension, postpartum hypertension may not have any symptoms. Depending on how high your blood pressure is, you may experience:  Headaches. These may be mild, moderate, or severe. They may also be steady, constant, or sudden in onset (thunderclap headache).  Changes in your ability to see (visual changes).  Dizziness.  Shortness of breath.  Swelling  of your hands, feet, lower legs, or face. In some cases, you may have swelling in more than one of these locations.  Heart palpitations or a racing heartbeat.  Difficulty breathing while lying down.  Decrease in the amount of urine that you pass. Other rare signs and symptoms may include:  Sweating more than usual. This lasts longer than a few days after delivery.  Chest pain.  Sudden dizziness when you get up from sitting or lying down.  Seizures.  Nausea or vomiting.  Abdominal pain. How is this diagnosed? This condition may be diagnosed based on the results of a physical exam, blood pressure measurements, and blood and urine tests. You may also have other tests, such as a CT scan or an MRI, to check for other problems of postpartum hypertension. How is this treated? If blood pressure is high enough to require treatment, your options may include:  Medicines to reduce blood pressure (antihypertensives). Tell your health care provider if you are breastfeeding or if you plan to breastfeed. There are many antihypertensive medicines that are safe to take while breastfeeding.  Stopping medicines that may be causing hypertension.  Treating medical conditions that are causing hypertension.  Treating the complications of hypertension, such as seizures, stroke, or kidney problems. Your health care provider will also continue to monitor your blood pressure closely until it is within a safe range for you. Follow these instructions at home:  Take over-the-counter and prescription medicines only as told by your health care provider.  Return to your normal activities as told by your health care provider. Ask your health care provider what activities are safe for you.  Do not use any products that contain nicotine or tobacco, such as cigarettes and e-cigarettes. If   you need help quitting, ask your health care provider.  Keep all follow-up visits as told by your health care provider. This  is important. Contact a health care provider if:  Your symptoms get worse.  You have new symptoms, such as: ? A headache that does not get better. ? Dizziness. ? Visual changes. Get help right away if:  You suddenly develop swelling in your hands, ankles, or face.  You have sudden, rapid weight gain.  You develop difficulty breathing, chest pain, racing heartbeat, or heart palpitations.  You develop severe pain in your abdomen.  You have any symptoms of a stroke. "BE FAST" is an easy way to remember the main warning signs of a stroke: ? B - Balance. Signs are dizziness, sudden trouble walking, or loss of balance. ? E - Eyes. Signs are trouble seeing or a sudden change in vision. ? F - Face. Signs are sudden weakness or numbness of the face, or the face or eyelid drooping on one side. ? A - Arms. Signs are weakness or numbness in an arm. This happens suddenly and usually on one side of the body. ? S - Speech. Signs are sudden trouble speaking, slurred speech, or trouble understanding what people say. ? T - Time. Time to call emergency services. Write down what time symptoms started.  You have other signs of a stroke, such as: ? A sudden, severe headache with no known cause. ? Nausea or vomiting. ? Seizure. These symptoms may represent a serious problem that is an emergency. Do not wait to see if the symptoms will go away. Get medical help right away. Call your local emergency services (911 in the [place] Do not drive yourself to the hospital. Summary  Postpartum hypertension is high blood pressure that remains higher than normal after childbirth.  In most cases, postpartum hypertension will go away on its own, usually within a week of delivery.  For some women, medical treatment is required to prevent serious complications, such as seizures or stroke. This information is not intended to replace advice given to you by your health care provider. Make sure you discuss any questions  you have with your health care provider. Document Revised: 12/02/2018 Document Reviewed: 08/16/2017 Elsevier Patient Education  2020 Elsevier Inc.  

## 2020-04-16 NOTE — MAU Provider Note (Signed)
History     CSN: [id number]  Arrival date and time: 04/16/20 1810    Chief Complaint  Patient presents with   engorgement   Headache   Hypertension   Neck Pain   swollen hands   Brittney Andrade is a 34 yo [id number] who is post partum day #9 s/p NSVD.   Headache  This is a new problem. The current episode started in the past 7 days. The problem occurs intermittently. The problem has been unchanged (resolve with tylenol and motrin). The pain is located in the bilateral region. The pain does not radiate. The pain quality is similar to prior headaches. The quality of the pain is described as aching. The pain is at a severity of 3/10. The pain is mild. Associated symptoms include back pain and neck pain. Pertinent negatives include no blurred vision, coughing, dizziness, hearing loss, tingling, tinnitus, visual change, vomiting or weakness. Nothing aggravates the symptoms. She has tried acetaminophen and NSAIDs for the symptoms. The treatment provided significant relief. Her past medical history is significant for hypertension. (Recent vaginal delivery)  Back Pain This is a new problem. The current episode started yesterday. The problem occurs 2 to 4 times per day. The problem is unchanged. The pain is present in the thoracic spine. The quality of the pain is described as aching. The pain does not radiate. The pain is at a severity of 4/10. The pain is moderate. Worse during: worse prior to pumping/breast feeding. Exacerbated by: nothing. Associated symptoms include headaches. Pertinent negatives include no chest pain, tingling or weakness. Risk factors include pregnancy (breast feeding). She has tried nothing for the symptoms. Improvement on treatment: much improved with pumping and breast feeding.  Hypertension This is a new problem. The current episode started yesterday. The problem has been gradually worsening since onset. Condition status: 140-150s systolic over 80-90s diastolic. Associated  symptoms include headaches and neck pain. Pertinent negatives include no blurred vision, chest pain or palpitations. There are no associated agents to hypertension. Risk factors for coronary artery disease include obesity (post partum, gestational diabetes in this pregnancy). Past treatments include nothing. There is no history of angina, kidney disease or CAD/MI. Most likely 2/2 gestational hypertension.      OB History     Gravida  4   Para  3   Term  3   Preterm      AB  1   Living  1      SAB  1   TAB      Ectopic      Multiple  0   Live Births  1           Past Medical History:  Diagnosis Date   Diabetes mellitus without complication (HCC)    gestational DM   Tachycardia     Past Surgical History:  Procedure Laterality Date   DILATION AND CURETTAGE OF UTERUS      No family history on file.  Social History   Tobacco Use   Smoking status: Never Smoker  Substance Use Topics   Alcohol use: Never   Drug use: Never    Allergies: No Known Allergies  Medications Prior to Admission  Medication Sig Dispense Refill Last Dose   Docusate Calcium (STOOL SOFTENER PO) Take 1 capsule by mouth daily as needed.      ibuprofen (ADVIL) 600 MG tablet Take 1 tablet (600 mg total) by mouth every 6 (six) hours. 45 tablet 1    Prenatal Vit-Fe Fumarate-FA (PRENATAL  MULTIVITAMIN) TABS tablet Take 1 tablet by mouth daily at 12 noon. 100 tablet 3     Review of Systems  Constitutional: Negative.   HENT: Negative for hearing loss and tinnitus.   Eyes: Negative.  Negative for blurred vision.  Respiratory: Negative for cough.   Cardiovascular: Negative for chest pain, palpitations and leg swelling.  Gastrointestinal: Negative for vomiting.  Endocrine: Negative.   Genitourinary: Negative.   Musculoskeletal: Positive for back pain and neck pain.  Skin: Negative.   Allergic/Immunologic: Negative.   Neurological: Positive for headaches. Negative for dizziness,  tingling and weakness.  Hematological: Negative.   Psychiatric/Behavioral: Negative.    Physical Exam   Blood pressure (!) 149/85, pulse 68, temperature 99.3 F (37.4 C), temperature source Oral, resp. rate 18, SpO2 99 %, currently breastfeeding.  Physical Exam  Nursing note and vitals reviewed. Constitutional: She is oriented to person, place, and time. She appears well-developed and well-nourished.  HENT:  Head: Normocephalic and atraumatic.  Eyes: Pupils are equal, round, and reactive to light. Conjunctivae and EOM are normal.  Cardiovascular: Normal rate, regular rhythm, normal heart sounds and intact distal pulses.  Respiratory: Effort normal and breath sounds normal.  GI: Soft. Bowel sounds are normal.  Musculoskeletal:     Cervical back: Normal range of motion and neck supple.     Comments: Mild hypertonicity in upper-thoracic spine  Neurological: She is alert and oriented to person, place, and time. She has normal reflexes.  Skin: Skin is warm and dry.  Psychiatric: She has a normal mood and affect. Her behavior is normal. Judgment and thought content normal.    MAU Course  Procedures  MDM - Postpartum Pre-E v gHTN - Pre-E labs ordered - Patient denies having headache at this time, back pain mostly likely related to breast engorgement from lactating (head may have some element of tension headache as well. - BP not severe range  Results for orders placed or performed during the hospital encounter of 04/16/20 (from the past 24 hour(s))  Protein / creatinine ratio, urine     Status: None   Collection Time: 04/16/20  6:20 PM  Result Value Ref Range   Creatinine, Urine 28.18 mg/dL   Total Protein, Urine <6 mg/dL   Protein Creatinine Ratio        0.00 - 0.15 mg/mg[Cre]  CBC     Status: Abnormal   Collection Time: 04/16/20  6:25 PM  Result Value Ref Range   WBC 10.1 4.0 - 10.5 K/uL   RBC 3.52 (L) 3.87 - 5.11 MIL/uL   Hemoglobin 10.1 (L) 12.0 - 15.0 g/dL   HCT 31.3 (L)  36.0 - 46.0 %   MCV 88.9 80.0 - 100.0 fL   MCH 28.7 26.0 - 34.0 pg   MCHC 32.3 30.0 - 36.0 g/dL   RDW 13.9 11.5 - 15.5 %   Platelets 265 150 - 400 K/uL   nRBC 0.0 0.0 - 0.2 %  Comprehensive metabolic panel     Status: Abnormal   Collection Time: 04/16/20  6:25 PM  Result Value Ref Range   Sodium 140 135 - 145 mmol/L   Potassium 4.2 3.5 - 5.1 mmol/L   Chloride 105 98 - 111 mmol/L   CO2 25 22 - 32 mmol/L   Glucose, Bld 105 (H) 70 - 99 mg/dL   BUN 11 6 - 20 mg/dL   Creatinine, Ser 0.66 0.44 - 1.00 mg/dL   Calcium 8.3 (L) 8.9 - 10.3 mg/dL   Total Protein 5.9 (  L) 6.5 - 8.1 g/dL   Albumin 2.8 (L) 3.5 - 5.0 g/dL   AST 16 15 - 41 U/L   ALT 22 0 - 44 U/L   Alkaline Phosphatase 90 38 - 126 U/L   Total Bilirubin 0.4 0.3 - 1.2 mg/dL   GFR calc non Af Amer >60 >60 mL/min   GFR calc Af Amer >60 >60 mL/min   Anion gap 10 5 - 15    Assessment and Plan  34 yo [id number] presenting to MAU with new onset post partum GHTN - P:Cr 0.2 - Start enalapril 5 mg daily - HA and back pain most likely related to breast engorgement from lactating, recommended heating pad and regular pumping/breastfeeding; tylenol and motrin for pain - Recommend follow up in her OBGYN provider's office Thursday or Friday for BP check - DC to home with strict return precautions, educated on signs and symptoms of post partum Pre-E.  Brittney Andrade 04/16/2020, 9:57 PM

## 2020-04-16 NOTE — MAU Note (Signed)
Pt d/c from family room. Pt signed paper AVS.

## 2020-04-19 ENCOUNTER — Observation Stay (HOSPITAL_COMMUNITY)
Admission: AD | Admit: 2020-04-19 | Discharge: 2020-04-19 | Disposition: A | Discharge status: Home / Self Care | Payer: BC Managed Care – PPO | Source: Ambulatory Visit | Attending: Obstetrics and Gynecology | Admitting: Obstetrics and Gynecology

## 2020-04-19 ENCOUNTER — Inpatient Hospital Stay (HOSPITAL_COMMUNITY): Payer: BC Managed Care – PPO

## 2020-04-19 ENCOUNTER — Encounter (HOSPITAL_COMMUNITY): Payer: Self-pay | Admitting: Obstetrics and Gynecology

## 2020-04-19 ENCOUNTER — Other Ambulatory Visit: Payer: Self-pay

## 2020-04-19 DIAGNOSIS — Z20822 Contact with and (suspected) exposure to covid-19: Secondary | ICD-10-CM | POA: Diagnosis not present

## 2020-04-19 DIAGNOSIS — O165 Unspecified maternal hypertension, complicating the puerperium: Secondary | ICD-10-CM | POA: Insufficient documentation

## 2020-04-19 DIAGNOSIS — J189 Pneumonia, unspecified organism: Secondary | ICD-10-CM | POA: Diagnosis present

## 2020-04-19 DIAGNOSIS — J181 Lobar pneumonia, unspecified organism: Secondary | ICD-10-CM

## 2020-04-19 DIAGNOSIS — O9953 Diseases of the respiratory system complicating the puerperium: Principal | ICD-10-CM | POA: Insufficient documentation

## 2020-04-19 DIAGNOSIS — R0789 Other chest pain: Secondary | ICD-10-CM

## 2020-04-19 DIAGNOSIS — Z8709 Personal history of other diseases of the respiratory system: Secondary | ICD-10-CM

## 2020-04-19 LAB — COMPREHENSIVE METABOLIC PANEL
ALT: 22 U/L (ref 0–44)
AST: 15 U/L (ref 15–41)
Albumin: 3.1 g/dL — ABNORMAL LOW (ref 3.5–5.0)
Alkaline Phosphatase: 95 U/L (ref 38–126)
Anion gap: 6 (ref 5–15)
BUN: 9 mg/dL (ref 6–20)
CO2: 25 mmol/L (ref 22–32)
Calcium: 9.4 mg/dL (ref 8.9–10.3)
Chloride: 108 mmol/L (ref 98–111)
Creatinine, Ser: 0.71 mg/dL (ref 0.44–1.00)
GFR calc Af Amer: >60 mL/min (ref >60)
GFR calc non Af Amer: >60 mL/min (ref >60)
Glucose, Bld: 96 mg/dL (ref 70–99)
Potassium: 3.6 mmol/L (ref 3.5–5.1)
Sodium: 139 mmol/L (ref 135–145)
Total Bilirubin: 0.3 mg/dL (ref 0.3–1.2)
Total Protein: 6.3 g/dL — ABNORMAL LOW (ref 6.5–8.1)

## 2020-04-19 LAB — CBC
HCT: 32.4 % — ABNORMAL LOW (ref 36.0–46.0)
Hemoglobin: 10.6 g/dL — ABNORMAL LOW (ref 12.0–15.0)
MCH: 29 pg (ref 26.0–34.0)
MCHC: 32.7 g/dL (ref 30.0–36.0)
MCV: 88.5 fL (ref 80.0–100.0)
Platelets: 275 10*3/uL (ref 150–400)
RBC: 3.66 MIL/uL — ABNORMAL LOW (ref 3.87–5.11)
RDW: 13.8 % (ref 11.5–15.5)
WBC: 9.8 10*3/uL (ref 4.0–10.5)
nRBC: 0 % (ref 0.0–0.2)

## 2020-04-19 LAB — PROTEIN / CREATININE RATIO, URINE
Creatinine, Urine: 23.74 mg/dL
Total Protein, Urine: <6 mg/dL

## 2020-04-19 LAB — SARS CORONAVIRUS 2 BY RT PCR (HOSPITAL ORDER, PERFORMED IN ~~LOC~~ HOSPITAL LAB): SARS Coronavirus 2: NEGATIVE

## 2020-04-19 MED ORDER — PRENATAL MULTIVITAMIN CH: 1.0000 | ORAL_TABLET | Freq: Every day | ORAL | Status: DC

## 2020-04-19 MED ORDER — IBUPROFEN 600 MG PO TABS: 600.0000 mg | ORAL_TABLET | Freq: Four times a day (QID) | ORAL | Status: DC | PRN

## 2020-04-19 MED ORDER — AMOXICILLIN-POT CLAVULANATE 875-125 MG PO TABS
1.0000 | ORAL_TABLET | Freq: Two times a day (BID) | ORAL | Status: DC
  Administered 2020-04-19: 1 via ORAL
  Filled 2020-04-19: qty 1

## 2020-04-19 MED ORDER — ACETAMINOPHEN 500 MG PO TABS
1000.0000 mg | ORAL_TABLET | Freq: Four times a day (QID) | ORAL | Status: DC | PRN
  Administered 2020-04-19: 1000 mg via ORAL
  Filled 2020-04-19: qty 2

## 2020-04-19 MED ORDER — ENALAPRIL MALEATE 5 MG PO TABS
5.0000 mg | ORAL_TABLET | Freq: Every day | ORAL | Status: DC
  Administered 2020-04-19: 5 mg via ORAL
  Filled 2020-04-19: qty 1

## 2020-04-19 MED ORDER — LEVOFLOXACIN 750 MG PO TABS: 750.0000 mg | ORAL_TABLET | Freq: Every day | ORAL | 0 refills | Status: AC

## 2020-04-19 MED ORDER — ACETAMINOPHEN 500 MG PO TABS: 1000.0000 mg | ORAL_TABLET | Freq: Four times a day (QID) | ORAL | 0 refills | Status: AC | PRN

## 2020-04-19 MED ORDER — LEVOFLOXACIN 750 MG PO TABS
750.0000 mg | ORAL_TABLET | Freq: Every day | ORAL | Status: DC
  Administered 2020-04-19: 750 mg via ORAL
  Filled 2020-04-19: qty 1

## 2020-04-19 NOTE — MAU Provider Note (Signed)
Chief Complaint:  Hypertension   First Provider Initiated Contact with Patient 04/19/20 0048      HPI: Brittney Andrade is a 34 y.o. [id number] who presents to maternity admissions reporting pressure around upper abdomen/lower chest "like someone is hugging me tightly".  BP elevated at home.  Was seen here 3 days with new postpartum hypertension.  Started on .enalapril.   She reports vaginal bleeding but no vaginal itching/burning, urinary symptoms, h/a, dizziness, n/v, or fever/chills.   Has a history of pulmonary edema with last pregnancy, thought to be related to over-hydration.  Seen by cardiology in September 2020 and they did not feel she was at risk for recurrence.  HPI RN Note: About an hour ago pt felt like "someone was hugging me or squeezing me". My b/p was 197/121. Was seen here 3 days ago with HTN. Started on bp med yesterday and accidentally took a double dose but took prescribed dose today. Denies h/a or blurry vision today  Past Medical History: Past Medical History:  Diagnosis Date  . Diabetes mellitus without complication (HCC)    gestational DM  . Tachycardia     Past obstetric history: OB History  Gravida Para Term Preterm AB Living  4 3 3   1 3   SAB TAB Ectopic Multiple Live Births  1     0 3    # Outcome Date GA Lbr Len/2nd Weight Sex Delivery Anes PTL Lv  4 Term 04/07/20 [redacted]w[redacted]d 02:41 / 00:02 3040 g F Vag-Spont None  LIV     Birth Comments: WNL  3 SAB           2 Term      Vag-Spont     1 Term      Vag-Vacuum       Past Surgical History: Past Surgical History:  Procedure Laterality Date  . DILATION AND CURETTAGE OF UTERUS      Family History: History reviewed. No pertinent family history.  Social History: Social History   Tobacco Use  . Smoking status: Never Smoker  Substance Use Topics  . Alcohol use: Never  . Drug use: Never    Allergies: No Known Allergies  Meds:  Medications Prior to Admission  Medication Sig Dispense Refill Last Dose   . Docusate Calcium (STOOL SOFTENER PO) Take 1 capsule by mouth daily as needed.   04/18/2020 at Unknown time  . Prenatal Vit-Fe Fumarate-FA (PRENATAL MULTIVITAMIN) TABS tablet Take 1 tablet by mouth daily at 12 noon. 100 tablet 3 04/18/2020 at Unknown time  . enalapril (VASOTEC) 10 MG tablet Take 1 tablet (10 mg total) by mouth daily. 30 tablet 1 04/17/2020  . ibuprofen (ADVIL) 600 MG tablet Take 1 tablet (600 mg total) by mouth every 6 (six) hours. 45 tablet 1     I have reviewed patient's Past Medical Hx, Surgical Hx, Family Hx, Social Hx, medications and allergies.  ROS:  Review of Systems  Constitutional: Negative for chills and fever.  Eyes: Negative for visual disturbance.  Respiratory: Positive for chest tightness (Like "someone is hugging me" pressure). Negative for shortness of breath.   Gastrointestinal: Negative for abdominal pain, constipation, diarrhea and nausea.  Genitourinary: Negative for difficulty urinating, flank pain and pelvic pain.  Neurological: Negative for weakness and headaches.   Other systems negative    Physical Exam   Patient Vitals for the past 24 hrs:  BP Temp Pulse Resp SpO2 Height Weight  04/19/20 0033 139/71 -- 69 -- -- -- --  04/19/20  0014 (!) 154/75 -- -- -- -- -- --  04/19/20 0012 -- 99.1 F (37.3 C) 72 18 98 % 5\' 3"  (1.6 m) 100.2 kg   Vitals:   04/19/20 0033 04/19/20 0044 04/19/20 0117 04/19/20 0145  BP: 139/71 (!) 149/82 (!) 144/81 (!) 141/67  Pulse: 69 66 68 68  Resp:      Temp:      SpO2:      Weight:      Height:       Constitutional: Well-developed, well-nourished female in no acute distress.  Cardiovascular: normal rate and rhythm, no ectopy audible, S1 & S2 heard, no murmur Respiratory: normal effort, no distress. Lungs CTAB with no wheezes or crackles GI: Abd soft, non-tender.  Nondistended.  No rebound, No guarding.  Bowel Sounds audible  MS: Extremities nontender, no edema, normal ROM Neurologic: Alert and oriented x 4.    Grossly nonfocal. GU: Neg CVAT. Skin:  Warm and Dry Psych:  Affect appropriate.  PELVIC EXAM: deferred   Labs: Results for orders placed or performed during the hospital encounter of 04/19/20 (from the past 24 hour(s))  Protein / creatinine ratio, urine     Status: None   Collection Time: 04/19/20 12:35 AM  Result Value Ref Range   Creatinine, Urine 23.74 mg/dL   Total Protein, Urine <6 mg/dL   Protein Creatinine Ratio        0.00 - 0.15 mg/mg[Cre]  CBC     Status: Abnormal   Collection Time: 04/19/20 12:45 AM  Result Value Ref Range   WBC 9.8 4.0 - 10.5 K/uL   RBC 3.66 (L) 3.87 - 5.11 MIL/uL   Hemoglobin 10.6 (L) 12.0 - 15.0 g/dL   HCT 06/19/20 (L) 36 - 46 %   MCV 88.5 80.0 - 100.0 fL   MCH 29.0 26.0 - 34.0 pg   MCHC 32.7 30.0 - 36.0 g/dL   RDW 02.4 09.7 - 35.3 %   Platelets 275 150 - 400 K/uL   nRBC 0.0 0.0 - 0.2 %  Comprehensive metabolic panel     Status: Abnormal   Collection Time: 04/19/20 12:45 AM  Result Value Ref Range   Sodium 139 135 - 145 mmol/L   Potassium 3.6 3.5 - 5.1 mmol/L   Chloride 108 98 - 111 mmol/L   CO2 25 22 - 32 mmol/L   Glucose, Bld 96 70 - 99 mg/dL   BUN 9 6 - 20 mg/dL   Creatinine, Ser 06/19/20 0.44 - 1.00 mg/dL   Calcium 9.4 8.9 - 2.42 mg/dL   Total Protein 6.3 (L) 6.5 - 8.1 g/dL   Albumin 3.1 (L) 3.5 - 5.0 g/dL   AST 15 15 - 41 U/L   ALT 22 0 - 44 U/L   Alkaline Phosphatase 95 38 - 126 U/L   Total Bilirubin 0.3 0.3 - 1.2 mg/dL   GFR calc non Af Amer >60 >60 mL/min   GFR calc Af Amer >60 >60 mL/min   Anion gap 6 5 - 15    --/--/B POS, B POS Performed at [facility], [address] [place], [place]  [phone])  Imaging:  DG Chest 2 View  Result Date: 04/19/2020 CLINICAL DATA:  Chest pain EXAM: CHEST - 2 VIEW COMPARISON:  None. FINDINGS: There are hazy airspace opacities at the right lung base and in the right upper lung zone. There is no pneumothorax. There is no significant pleural effusion. The heart size is normal.  There is no  acute osseous abnormality. IMPRESSION: Hazy airspace opacities in the right upper lung zone and right lung base concerning for pneumonia in the appropriate clinical setting. Electronically Signed   By: Constance Holster M.D.   On: 04/19/2020 01:51   MAU Course/MDM: I have ordered labs as follows:  Preeclampsia labs, all normal  Imaging ordered: Chest xray which showed R upper and lower opacities concerning for pneumonia Oxygen saturations have remained over 98% Results reviewed.   Consult Dr Terri Piedra. She requests we get another Covid test . >>  Negative Decision made to admit for observation and have Medicine or pulmonary consult this am.  Assessment: Postpartum Day #12 Postpartum hypertension Right upper and lower lobe pneumonia  Plan: Admit to [facility] Observation Augmentin bid MD to follow    Hansel Feinstein CNM, MSN Certified Nurse-Midwife 04/19/2020 12:49 AM

## 2020-04-19 NOTE — Plan of Care (Signed)

## 2020-04-19 NOTE — Progress Notes (Signed)
Received call from RN that patient was experiencing chills, felt very fatigued around 11:30 and had repeat episode of "squeezing sensation underneath ribs." SRBP during this episode, however rechecked to normal normotensive within 76m. Patient started on scheduled tylenol, chills resolved. Highest temp 99.7@ 1131.   At time of evaluation, patient denies squeezing feeling as well as cough (productive OR non-productive), frank chest pain, palpitations, wheezing, SOB, swelling in extremities. No EKG done on admission.   CV exam: RRR< mildly decreased breath sounds at right base but otherwise negative for crackles, rales, wheezing.   Reviewed afternoon with Dr Katrinka Blazing of medicine once again, appreciate input. Would get EKG at this time  Patient feels overall improved since admission, requests discharge tonight if possible. Will await results of EKG and follow vitals at this time.

## 2020-04-19 NOTE — Progress Notes (Signed)
Discharge instructions reviewed, questions asked and answered. Significant other at bedside.

## 2020-04-19 NOTE — MAU Note (Signed)
PT SAYS SHE ACCIDENTALLY DOUBLED HER DOSE OF MEDS  ON WED -. MED IS SCH FOR 12NOON - THEN TOOK ANOTHER AT 830PM.  . ON Thursday - ONLY 1 DOSE.  STARTED FEELING TIGHTNESS AROUND CHEST -  ON Thursday AFTERNOON .  HAS HAD DIARRHEA X2 . SHE TOOK BP ON Thursday  AT 1130PM- 194/120.   NOW  THE SQUEEZE FEELING IS LESS.

## 2020-04-19 NOTE — H&P (Signed)
Brittney Andrade is a 34 y.o. [phone] on PPD##12 with complaint of elevated BP at home of 197/121. On arrival to MAU, pt reported associated pressure around upper abdomen/lower chest "like someone is hugging me tightly". Pt was diagnosed with new onset hypertension on PPD#8 when presented to MAU. She had a negative preE workup then thus was discharged home on enalapril.  Pt reports accidentally took twice recommended dose on first day home but only one tab yesterday She reports vaginal bleeding but no vaginal itching/burning, urinary symptoms, h/a, dizziness, n/v, or fever/chills.   Pt has a history of pulmonary edema with last pregnancy, thought to be related to over-hydration.  Seen by cardiology in September 2020   Past Medical History:     Past Medical History:  Diagnosis Date  . Diabetes mellitus without complication (HCC)    gestational DM  . Tachycardia     Past obstetric history:                 OB History  Gravida Para Term Preterm AB Living  4 3 3   1 3   SAB TAB Ectopic Multiple Live Births     1     0 3       # Outcome Date GA Lbr Len/2nd Weight Sex Delivery Anes PTL Lv  4 Term 04/07/20 [redacted]w[redacted]d 02:41 / 00:02 3040 g F Vag-Spont None  LIV     Birth Comments: WNL  3 SAB           2 Term      Vag-Spont     1 Term      Vag-Vacuum       Past Surgical History:      Past Surgical History:  Procedure Laterality Date  . DILATION AND CURETTAGE OF UTERUS      Family History: History reviewed. No pertinent family history.  Social History: Social History       Tobacco Use  . Smoking status: Never Smoker  Substance Use Topics  . Alcohol use: Never  . Drug use: Never    Allergies: No Known Allergies  Meds:         Medications Prior to Admission  Medication Sig Dispense Refill Last Dose  . Docusate Calcium (STOOL SOFTENER PO) Take 1 capsule by mouth daily as needed.   04/18/2020 at Unknown time  . Prenatal Vit-Fe  Fumarate-FA (PRENATAL MULTIVITAMIN) TABS tablet Take 1 tablet by mouth daily at 12 noon. 100 tablet 3 04/18/2020 at Unknown time  . enalapril (VASOTEC) 10 MG tablet Take 1 tablet (10 mg total) by mouth daily. 30 tablet 1 04/17/2020  . ibuprofen (ADVIL) 600 MG tablet Take 1 tablet (600 mg total) by mouth every 6 (six) hours. 45 tablet 1     I have reviewed patient's Past Medical Hx, Surgical Hx, Family Hx, Social Hx, medications and allergies.  ROS:  Review of Systems  Constitutional: Negative for chills and fever.  Eyes: Negative for visual disturbance.  Respiratory: Positive for chest tightness (Like "someone is hugging me" pressure). Negative for shortness of breath.   Gastrointestinal: Negative for abdominal pain, constipation, diarrhea and nausea.  Genitourinary: Negative for difficulty urinating, flank pain and pelvic pain.  Neurological: Negative for weakness and headaches.   Other systems negative    Physical Exam   Patient Vitals for the past 24 hrs:  BP Temp Pulse Resp SpO2 Height Weight  04/19/20 0033 139/71 -- 69 -- -- -- --  04/19/20 0014 (!) 154/75 -- -- -- -- -- --  04/19/20 0012 -- 99.1 F (37.3 C) 72 18 98 % 5\' 3"  (1.6 m) 100.2 kg         Vitals:   04/19/20 0033 04/19/20 0044 04/19/20 0117 04/19/20 0145  BP: 139/71 (!) 149/82 (!) 144/81 (!) 141/67  Pulse: 69 66 68 68  Resp:      Temp:      SpO2:      Weight:      Height:       Constitutional: NAD Cardiovascular: normal rate and rhythm,  Respiratory: normal effort, no distress. Lungs CTAB with no wheezes or crackles GI: Abd soft, non-tender.  Nondistended.  No rebound, No guarding.  MS: Extremities nontender, no edema, normal ROM Neurologic: Alert and oriented x 3.    PELVIC EXAM: deferred   Labs: Lab Results Last 24 Hours       Results for orders placed or performed during the hospital encounter of 04/19/20 (from the past 24 hour(s))  Protein / creatinine ratio, urine      Status: None   Collection Time: 04/19/20 12:35 AM  Result Value Ref Range   Creatinine, Urine 23.74 mg/dL   Total Protein, Urine <6 mg/dL   Protein Creatinine Ratio        0.00 - 0.15 mg/mg[Cre]  CBC     Status: Abnormal   Collection Time: 04/19/20 12:45 AM  Result Value Ref Range   WBC 9.8 4.0 - 10.5 K/uL   RBC 3.66 (L) 3.87 - 5.11 MIL/uL   Hemoglobin 10.6 (L) 12.0 - 15.0 g/dL   HCT 06/19/20 (L) 36 - 46 %   MCV 88.5 80.0 - 100.0 fL   MCH 29.0 26.0 - 34.0 pg   MCHC 32.7 30.0 - 36.0 g/dL   RDW 63.0 16.0 - 10.9 %   Platelets 275 150 - 400 K/uL   nRBC 0.0 0.0 - 0.2 %  Comprehensive metabolic panel     Status: Abnormal   Collection Time: 04/19/20 12:45 AM  Result Value Ref Range   Sodium 139 135 - 145 mmol/L   Potassium 3.6 3.5 - 5.1 mmol/L   Chloride 108 98 - 111 mmol/L   CO2 25 22 - 32 mmol/L   Glucose, Bld 96 70 - 99 mg/dL   BUN 9 6 - 20 mg/dL   Creatinine, Ser 06/19/20 0.44 - 1.00 mg/dL   Calcium 9.4 8.9 - 5.57 mg/dL   Total Protein 6.3 (L) 6.5 - 8.1 g/dL   Albumin 3.1 (L) 3.5 - 5.0 g/dL   AST 15 15 - 41 U/L   ALT 22 0 - 44 U/L   Alkaline Phosphatase 95 38 - 126 U/L   Total Bilirubin 0.3 0.3 - 1.2 mg/dL   GFR calc non Af Amer >60 >60 mL/min   GFR calc Af Amer >60 >60 mL/min   Anion gap 6 5 - 15      --/--/B POS, B POS Performed at [facility], [address], [place], [place]  [phone])  COVID Screen : negative  Imaging:   Imaging Results  DG Chest 2 View  Result Date: 04/19/2020 CLINICAL DATA:  Chest pain EXAM: CHEST - 2 VIEW COMPARISON:  None. FINDINGS: There are hazy airspace opacities at the right lung base and in the right upper lung zone. There is no pneumothorax. There is no significant pleural effusion. The heart size is normal. There is no acute osseous abnormality. IMPRESSION: Hazy airspace opacities in the right upper lung zone and right lung  base concerning for pneumonia in the appropriate clinical  setting. Electronically Signed   By: Constance Holster M.D.   On: 04/19/2020 01:51      Assessment/Plan: Admit for observation Postpartum Day #12 Postpartum hypertension: 130/66 ( 130-163/66-86), 77, 18, 98.6; O2 sat 99 RA Right upper and lower lobe pneumonia:started on Augmentin BID, consult Pulmonary Team

## 2020-04-19 NOTE — Progress Notes (Signed)
Patient discharged home.  Ambulatory to car with significant other escorted by Helayne [place]in[place], NT.

## 2020-04-19 NOTE — Progress Notes (Signed)
Patient ID: Brittney Andrade, female   DOB: 08-Sep-1986, 34 y.o.   MRN: [id number] Had phone consult with Dr Katrinka Blazing of Hospitalist team   Recommends D/C augmentin and rather start pt on Levaquin 750mg  po day   Likely discharge to home later today if BP still stable and pt remains afebrile

## 2020-04-19 NOTE — MAU Note (Signed)
About an hour ago pt felt like "someone was hugging me or squeezing me". My b/p was 197/121. Was seen here 3 days ago with HTN. Started on bp med yesterday and accidentally took a double dose but took prescribed dose today. Denies h/a or blurry vision today

## 2020-04-19 NOTE — MAU Note (Signed)
USING BREAST PUMP

## 2020-04-19 NOTE — Progress Notes (Signed)
Preliminary EKG read notable for "NSR with sinus arrhythmia, septal infarct age indeterminate, abnormal EKG." Patient continues to deny chest pain, palpitations, cough, SOB, DOE. No more "chest squeezing" episodes. Spoke with Dr Jacques Navy of cardiology, appreciate information. Patient may be cleared for discharge home with close follow-up in 2-4wks with cardiology. Patient aware of warning signs (as mentioned above) in which to return to ED.   BP (!) 151/78 (BP Location: Left Arm)   Pulse 63   Temp 98.3 F (36.8 C) (Oral)   Resp 16   Ht 5\' 3"  (1.6 m)   Wt 100.2 kg   LMP  (Exact Date)   SpO2 97%   BMI 39.15 kg/m   Plan for PCP s/u in 1-2wks for PNA. Will keep BP long and bring to both that appt and cardiology appointment. DC with PO tylenol and PO levoquin to complete antibiotics 7d course

## 2020-04-21 NOTE — Discharge Summary (Signed)
OB Discharge Summary     Patient Name: Brittney Andrade DOB: 1986-03-23 MRN: [id number]  Date of admission: 04/19/2020 Delivering MD: This patient has no babies on file.  Date of discharge: 04/21/2020  Admitting diagnosis: Right lower lobe pneumonia [J18.9]  Secondary diagnosis:  Active Problems:   Right lower lobe pneumonia  Additional problems:      Discharge diagnosis: same as above                                                                                            Hospital course:  Patient was admitted PPD#12 on 6/11 AM after complaints of chest discomfort and elevated BP led to workup including chest XRAY and PreE labs. Labs WNL, CXR showed right-sided PNA, PO Levoquin and scheduled Tylenol ordered as patient was experiencing fevers and chills. Intermittent squeezing under chest occurred once more during admission, prompting EKG and cardiac curbside. EKG showed " NSR with sinus arrhythmia, septal infarct age indeterminate, abnormal EKG." Per Cardiology, given now normal Bps and overall VSS, can discharge home and f/u outpatient with cardiology in 2-4wks. Patient discharged on PO antibiotics in stable condition.   Physical exam  Vitals:   04/19/20 1301 04/19/20 1453 04/19/20 1556 04/19/20 1920  BP:  (!) 153/87 (!) 131/58 (!) 151/78  Pulse:  69 64 63  Resp:   18 16  Temp: 98.7 F (37.1 C)  98.2 F (36.8 C) 98.3 F (36.8 C)  TempSrc:   Oral Oral  SpO2:   95% 97%  Weight:      Height:       Labs: Lab Results  Component Value Date   WBC 9.8 04/19/2020   HGB 10.6 (L) 04/19/2020   HCT 32.4 (L) 04/19/2020   MCV 88.5 04/19/2020   PLT 275 04/19/2020   CMP Latest Ref Rng & Units 04/19/2020  Glucose 70 - 99 mg/dL 96  BUN 6 - 20 mg/dL 9  Creatinine 0.44 - 1.00 mg/dL 0.71  Sodium 135 - 145 mmol/L 139  Potassium 3.5 - 5.1 mmol/L 3.6  Chloride 98 - 111 mmol/L 108  CO2 22 - 32 mmol/L 25  Calcium 8.9 - 10.3 mg/dL 9.4  Total Protein 6.5 - 8.1 g/dL 6.3(L)  Total  Bilirubin 0.3 - 1.2 mg/dL 0.3  Alkaline Phos 38 - 126 U/L 95  AST 15 - 41 U/L 15  ALT 0 - 44 U/L 22   IMAGING: CXR 6/11 IMPRESSION: Hazy airspace opacities in the right upper lung zone and right lung base concerning for pneumonia in the appropriate clinical setting.  Discharge instruction: per After Visit Summary and "Baby and Me Booklet".  After visit meds:  Allergies as of 04/19/2020   No Known Allergies     Medication List    TAKE these medications   acetaminophen 500 MG tablet Commonly known as: TYLENOL Take 2 tablets (1,000 mg total) by mouth every 6 (six) hours as needed for mild pain or fever.   enalapril 10 MG tablet Commonly known as: VASOTEC Take 1 tablet (10 mg total) by mouth daily.   ibuprofen 600 MG tablet Commonly known as: ADVIL Take  1 tablet (600 mg total) by mouth every 6 (six) hours.   levofloxacin 750 MG tablet Commonly known as: LEVAQUIN Take 1 tablet (750 mg total) by mouth daily.   prenatal multivitamin Tabs tablet Take 1 tablet by mouth daily at 12 noon.   STOOL SOFTENER PO Take 1 capsule by mouth daily as needed.      F/u with PCP in 1-2wks after pneumonia, 2-4wks with cardiology per inpatient recommendations   04/21/2020 Carlisle Cater, MD

## 2020-05-01 ENCOUNTER — Ambulatory Visit (INDEPENDENT_AMBULATORY_CARE_PROVIDER_SITE_OTHER): Payer: BC Managed Care – PPO | Admitting: Cardiology

## 2020-05-01 ENCOUNTER — Other Ambulatory Visit: Payer: Self-pay

## 2020-05-01 ENCOUNTER — Encounter: Payer: Self-pay | Admitting: Cardiology

## 2020-05-01 VITALS — BP 137/90 | HR 70 | Ht 63.0 in | Wt 214.2 lb

## 2020-05-01 DIAGNOSIS — R9431 Abnormal electrocardiogram [ECG] [EKG]: Secondary | ICD-10-CM | POA: Diagnosis not present

## 2020-05-01 DIAGNOSIS — O165 Unspecified maternal hypertension, complicating the puerperium: Secondary | ICD-10-CM | POA: Diagnosis not present

## 2020-05-01 MED ORDER — ENALAPRIL MALEATE 20 MG PO TABS: 20.0000 mg | ORAL_TABLET | Freq: Every day | ORAL | 1 refills | Status: AC

## 2020-05-01 NOTE — Progress Notes (Signed)
Cardiology Office Note:    Date:  05/01/2020   ID:  Brittney Andrade, DOB Jun 14, 1986, MRN [id number]  PCP:  System, Pcp Not In  Cardiologist:  No primary care provider on file.  Electrophysiologist:  None   Referring MD: No ref. provider found   Chief complaint: Abnormal EKG  History of Present Illness:    Brittney Andrade is a 34 y.o. female who presents for follow-up.  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  She has had 3 pregnancies.   During 3rd pregnancy in 07/2017 was feeling short of breath and had wheezing and LE edema.  Reports BNP elevated in clinic.  TTE was done which per her report showed normal EF.  She reports that her swelling resolved 2-3 months after delivery.  Took lasix 2-3 weeks after delivery.  Denies any chest pain, shortness of breath, or LE edema currently.  Went to ED 1 week post-partum with HR 150.  Work-up unremarkable.  She reports 3 weeks ago she was lying down and did not note any symptoms, but her watch recorded HR up to 151 bpm.    Since last clinic visit, she had uncomplicated delivery on 04/07/2020.  Presented to [facility] on 04/16/2020 with hypertension, was started on enalapril 5 mg daily.  She was admitted from 04/19/2020 through 04/21/2020.  She presented on 6/11 with complaints of chest pain elevated BP.  Chest x-ray showed right-sided pneumonia, she was started on Levaquin.  EKG was abnormal (Q waves in V1/2).  She was discharged from the hospital and told to follow-up with cardiology as an outpatient.  Since discharge, she reports that she is feeling better.  She has been checking her BP, has been 120s to 150s over 80s to 100s.  For the last week her SBP has been 120s 130s, but DBP persistently 80s to 90s.   Past Medical History:  Diagnosis Date  . Diabetes mellitus without complication (HCC)    gestational DM  . Tachycardia      Current Medications: Current Meds  Medication Sig  .  acetaminophen (TYLENOL) 500 MG tablet Take 2 tablets (1,000 mg total) by mouth every 6 (six) hours as needed for mild pain or fever.  Tery Sanfilippo Calcium (STOOL SOFTENER PO) Take 1 capsule by mouth daily as needed.  . enalapril (VASOTEC) 20 MG tablet Take 1 tablet (20 mg total) by mouth daily.  Marland Kitchen ibuprofen (ADVIL) 600 MG tablet Take 1 tablet (600 mg total) by mouth every 6 (six) hours.  Marland Kitchen levofloxacin (LEVAQUIN) 750 MG tablet Take 1 tablet (750 mg total) by mouth daily.  . Prenatal Vit-Fe Fumarate-FA (PRENATAL MULTIVITAMIN) TABS tablet Take 1 tablet by mouth daily at 12 noon.  . [DISCONTINUED] enalapril (VASOTEC) 10 MG tablet Take 1 tablet (10 mg total) by mouth daily.     Allergies:   Patient has no known allergies.   Social History   Socioeconomic History  . Marital status: Unknown    Spouse name: Not on file  . Number of children: Not on file  . Years of education: Not on file  . Highest education level: Not on file  Occupational History  . Not on file  Tobacco Use  . Smoking status: Never Smoker  . Smokeless tobacco: Never Used  Substance and Sexual Activity  . Alcohol use: Never  . Drug use: Never  . Sexual activity: Yes  Other Topics Concern  . Not on file  Social History  Narrative  . Not on file   Social Determinants of Health   Financial Resource Strain:   . Difficulty of Paying Living Expenses:   Food Insecurity:   . Worried About Charity fundraiser in the Last Year:   . Arboriculturist in the Last Year:   Transportation Needs:   . Film/video editor (Medical):   Marland Kitchen Lack of Transportation (Non-Medical):   Physical Activity:   . Days of Exercise per Week:   . Minutes of Exercise per Session:   Stress:   . Feeling of Stress :   Social Connections:   . Frequency of Communication with Friends and Family:   . Frequency of Social Gatherings with Friends and Family:   . Attends Religious Services:   . Active Member of Clubs or Organizations:   . Attends English as a second language teacher Meetings:   Marland Kitchen Marital Status:    3 pregnancies, 1 miscarriage  Family History: Mother: CAD, 5 stents Daughter: ?ASD  ROS:   Please see the history of present illness.    All other systems reviewed and are negative.  EKGs/Labs/Other Studies Reviewed:    The following studies were reviewed today:   EKG:  EKG is ordered today.  The ekg ordered today demonstrates normal sinus rhythm, rate 70, QTc 440, Q waves in V1/2, poor R wave progression, nonspecific T wave flattening  Recent Labs: 04/19/2020: ALT 22; BUN 9; Creatinine, Ser 0.71; Hemoglobin 10.6; Platelets 275; Potassium 3.6; Sodium 139  Recent Lipid Panel No results found for: CHOL, TRIG, HDL, CHOLHDL, VLDL, LDLCALC, LDLDIRECT  Physical Exam:    VS:  BP 137/90   Pulse 70   Ht 5\' 3"  (1.6 m)   Wt 214 lb 3.2 oz (97.2 kg)   SpO2 97%   BMI 37.94 kg/m     Wt Readings from Last 3 Encounters:  05/01/20 214 lb 3.2 oz (97.2 kg)  04/19/20 221 lb (100.2 kg)  04/07/20 226 lb (102.5 kg)     GEN:  Well nourished, well developed in no acute distress HEENT: Normal NECK: No JVD; No carotid bruits LYMPHATICS: No lymphadenopathy CARDIAC: RRR, no murmurs, rubs, gallops RESPIRATORY:  Clear to auscultation without rales, wheezing or rhonchi  ABDOMEN: Soft, non-tender, non-distended MUSCULOSKELETAL:  No edema; No deformity  SKIN: Warm and dry NEUROLOGIC:  Alert and oriented x 3 PSYCHIATRIC:  Normal affect   ASSESSMENT:    1. Abnormal EKG   2. Postpartum hypertension    PLAN:     Abnormal EKG: Septal Q waves and poor R wave progression.  Could represent normal variant, will check TTE to rule out structural heart disease  Hypertension: On enalapril 10 mg daily.  BP remains elevated, will increase to 20 mg daily.  Will check BMP.  Asked patient to monitor BP daily for next 2 weeks and call with results.  RTC in 3 months   Medication Adjustments/Labs and Tests Ordered: Current medicines are reviewed at length  with the patient today.  Concerns regarding medicines are outlined above.  Orders Placed This Encounter  Procedures  . Basic metabolic panel  . EKG 12-Lead  . ECHOCARDIOGRAM COMPLETE   Meds ordered this encounter  Medications  . enalapril (VASOTEC) 20 MG tablet    Sig: Take 1 tablet (20 mg total) by mouth daily.    Dispense:  30 tablet    Refill:  1    Dose increase    Patient Instructions  Medication Instructions:  INCREASE enalapril to 20  mg daily  *If you need a refill on your cardiac medications before your next appointment, please call your pharmacy*   Lab Work: Return for labs in 1 week (BMET)  If you have labs (blood work) drawn today and your tests are completely normal, you will receive your results only by: Marland Kitchen MyChart Message (if you have MyChart) OR . A paper copy in the mail If you have any lab test that is abnormal or we need to change your treatment, we will call you to review the results.   Testing/Procedures: Your physician has requested that you have an echocardiogram. Echocardiography is a painless test that uses sound waves to create images of your heart. It provides your doctor with information about the size and shape of your heart and how well your heart's chambers and valves are working. This procedure takes approximately one hour. There are no restrictions for this procedure. This will be done at our [facility] location:  [address]  Follow-Up: At [facility], you and your health needs are our priority.  As part of our continuing mission to provide you with exceptional heart care, we have created designated Provider Care Teams.  These Care Teams include your primary Cardiologist (physician) and Advanced Practice Providers (APPs -  Physician Assistants and Nurse Practitioners) who all work together to provide you with the care you need, when you need it.  We recommend signing up for the patient portal called "MyChart".  Sign up  information is provided on this After Visit Summary.  MyChart is used to connect with patients for Virtual Visits (Telemedicine).  Patients are able to view lab/test results, encounter notes, upcoming appointments, etc.  Non-urgent messages can be sent to your provider as well.   To learn more about what you can do with MyChart, go to [email or web address].    Your next appointment:   3 month(s)  The format for your next appointment:   In Person  Provider:   Epifanio Lesches, MD      Signed, Little Ishikawa, MD  05/01/2020 3:57 PM    [facility]

## 2020-05-01 NOTE — Patient Instructions (Signed)
Medication Instructions:  INCREASE enalapril to 20 mg daily  *If you need a refill on your cardiac medications before your next appointment, please call your pharmacy*   Lab Work: Return for labs in 1 week (BMET)  If you have labs (blood work) drawn today and your tests are completely normal, you will receive your results only by: Marland Kitchen MyChart Message (if you have MyChart) OR . A paper copy in the mail If you have any lab test that is abnormal or we need to change your treatment, we will call you to review the results.   Testing/Procedures: Your physician has requested that you have an echocardiogram. Echocardiography is a painless test that uses sound waves to create images of your heart. It provides your doctor with information about the size and shape of your heart and how well your heart's chambers and valves are working. This procedure takes approximately one hour. There are no restrictions for this procedure. This will be done at our [facility] location:  [address]  Follow-Up: At [facility], you and your health needs are our priority.  As part of our continuing mission to provide you with exceptional heart care, we have created designated Provider Care Teams.  These Care Teams include your primary Cardiologist (physician) and Advanced Practice Providers (APPs -  Physician Assistants and Nurse Practitioners) who all work together to provide you with the care you need, when you need it.  We recommend signing up for the patient portal called "MyChart".  Sign up information is provided on this After Visit Summary.  MyChart is used to connect with patients for Virtual Visits (Telemedicine).  Patients are able to view lab/test results, encounter notes, upcoming appointments, etc.  Non-urgent messages can be sent to your provider as well.   To learn more about what you can do with MyChart, go to [email or web address].    Your next appointment:   3 month(s)  The  format for your next appointment:   In Person  Provider:   Epifanio Lesches, MD

## 2020-05-09 ENCOUNTER — Other Ambulatory Visit: Payer: Self-pay

## 2020-05-09 ENCOUNTER — Ambulatory Visit (HOSPITAL_COMMUNITY)
Admission: RE | Admit: 2020-05-09 | Discharge: 2020-05-09 | Disposition: A | Discharge status: Home / Self Care | Payer: BC Managed Care – PPO | Source: Ambulatory Visit | Attending: Cardiology | Admitting: Cardiology

## 2020-05-09 ENCOUNTER — Other Ambulatory Visit: Payer: Self-pay | Admitting: Cardiology

## 2020-05-09 DIAGNOSIS — I10 Essential (primary) hypertension: Secondary | ICD-10-CM | POA: Diagnosis not present

## 2020-05-09 DIAGNOSIS — R000 Tachycardia, unspecified: Secondary | ICD-10-CM | POA: Insufficient documentation

## 2020-05-09 DIAGNOSIS — R9431 Abnormal electrocardiogram [ECG] [EKG]: Secondary | ICD-10-CM | POA: Insufficient documentation

## 2020-05-09 DIAGNOSIS — O903 Peripartum cardiomyopathy: Secondary | ICD-10-CM | POA: Diagnosis not present

## 2020-05-09 DIAGNOSIS — R0600 Dyspnea, unspecified: Secondary | ICD-10-CM | POA: Diagnosis not present

## 2020-05-09 DIAGNOSIS — E119 Type 2 diabetes mellitus without complications: Secondary | ICD-10-CM | POA: Insufficient documentation

## 2020-05-09 DIAGNOSIS — J189 Pneumonia, unspecified organism: Secondary | ICD-10-CM | POA: Insufficient documentation

## 2020-05-09 NOTE — Progress Notes (Signed)
  Echocardiogram 2D Echocardiogram has been performed.  Brittney Andrade 05/09/2020, 3:14 PM

## 2020-05-10 LAB — BASIC METABOLIC PANEL WITH GFR
BUN: 14 mg/dL (ref 7–25)
CO2: 29 mmol/L (ref 20–32)
Calcium: 9.6 mg/dL (ref 8.6–10.2)
Chloride: 103 mmol/L (ref 98–110)
Creat: 0.72 mg/dL (ref 0.50–1.10)
GFR, Est African American: 128 mL/min/{1.73_m2} (ref >=60)
GFR, Est Non African American: 111 mL/min/{1.73_m2} (ref >=60)
Glucose, Bld: 83 mg/dL (ref 65–139)
Potassium: 4.2 mmol/L (ref 3.5–5.3)
Sodium: 140 mmol/L (ref 135–146)

## 2020-07-16 ENCOUNTER — Ambulatory Visit
Admission: EM | Admit: 2020-07-16 | Discharge: 2020-07-16 | Discharge status: Against Medical Advice | Payer: BC Managed Care – PPO

## 2020-07-16 ENCOUNTER — Other Ambulatory Visit: Payer: Self-pay

## 2020-07-30 ENCOUNTER — Ambulatory Visit: Payer: BC Managed Care – PPO | Admitting: Cardiology

## 2020-08-19 IMAGING — DX DG CHEST 2V
2 series · 2 of 2 positions shown · non-contrast
Comparison: None.

CLINICAL DATA: Chest pain

EXAM:
CHEST - 2 VIEW

[chest pa]
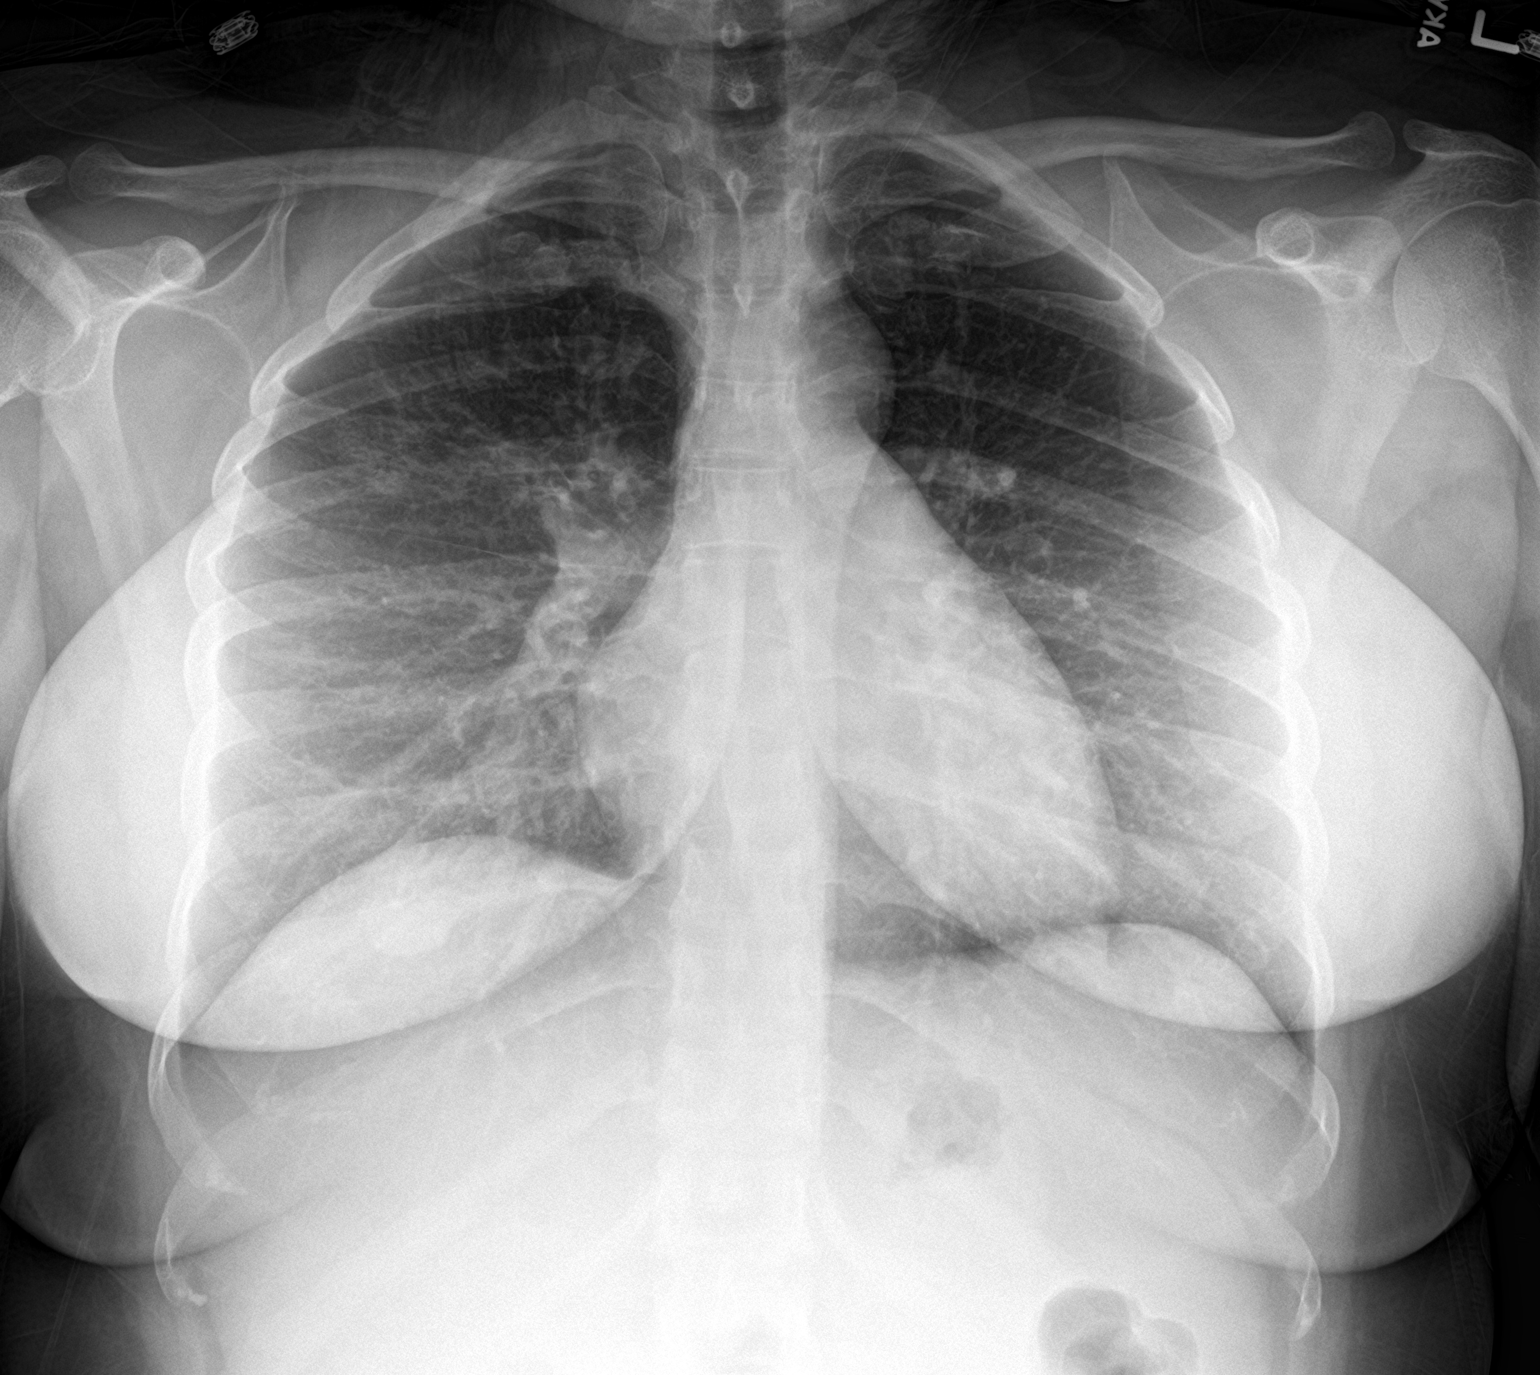

[chest lat]
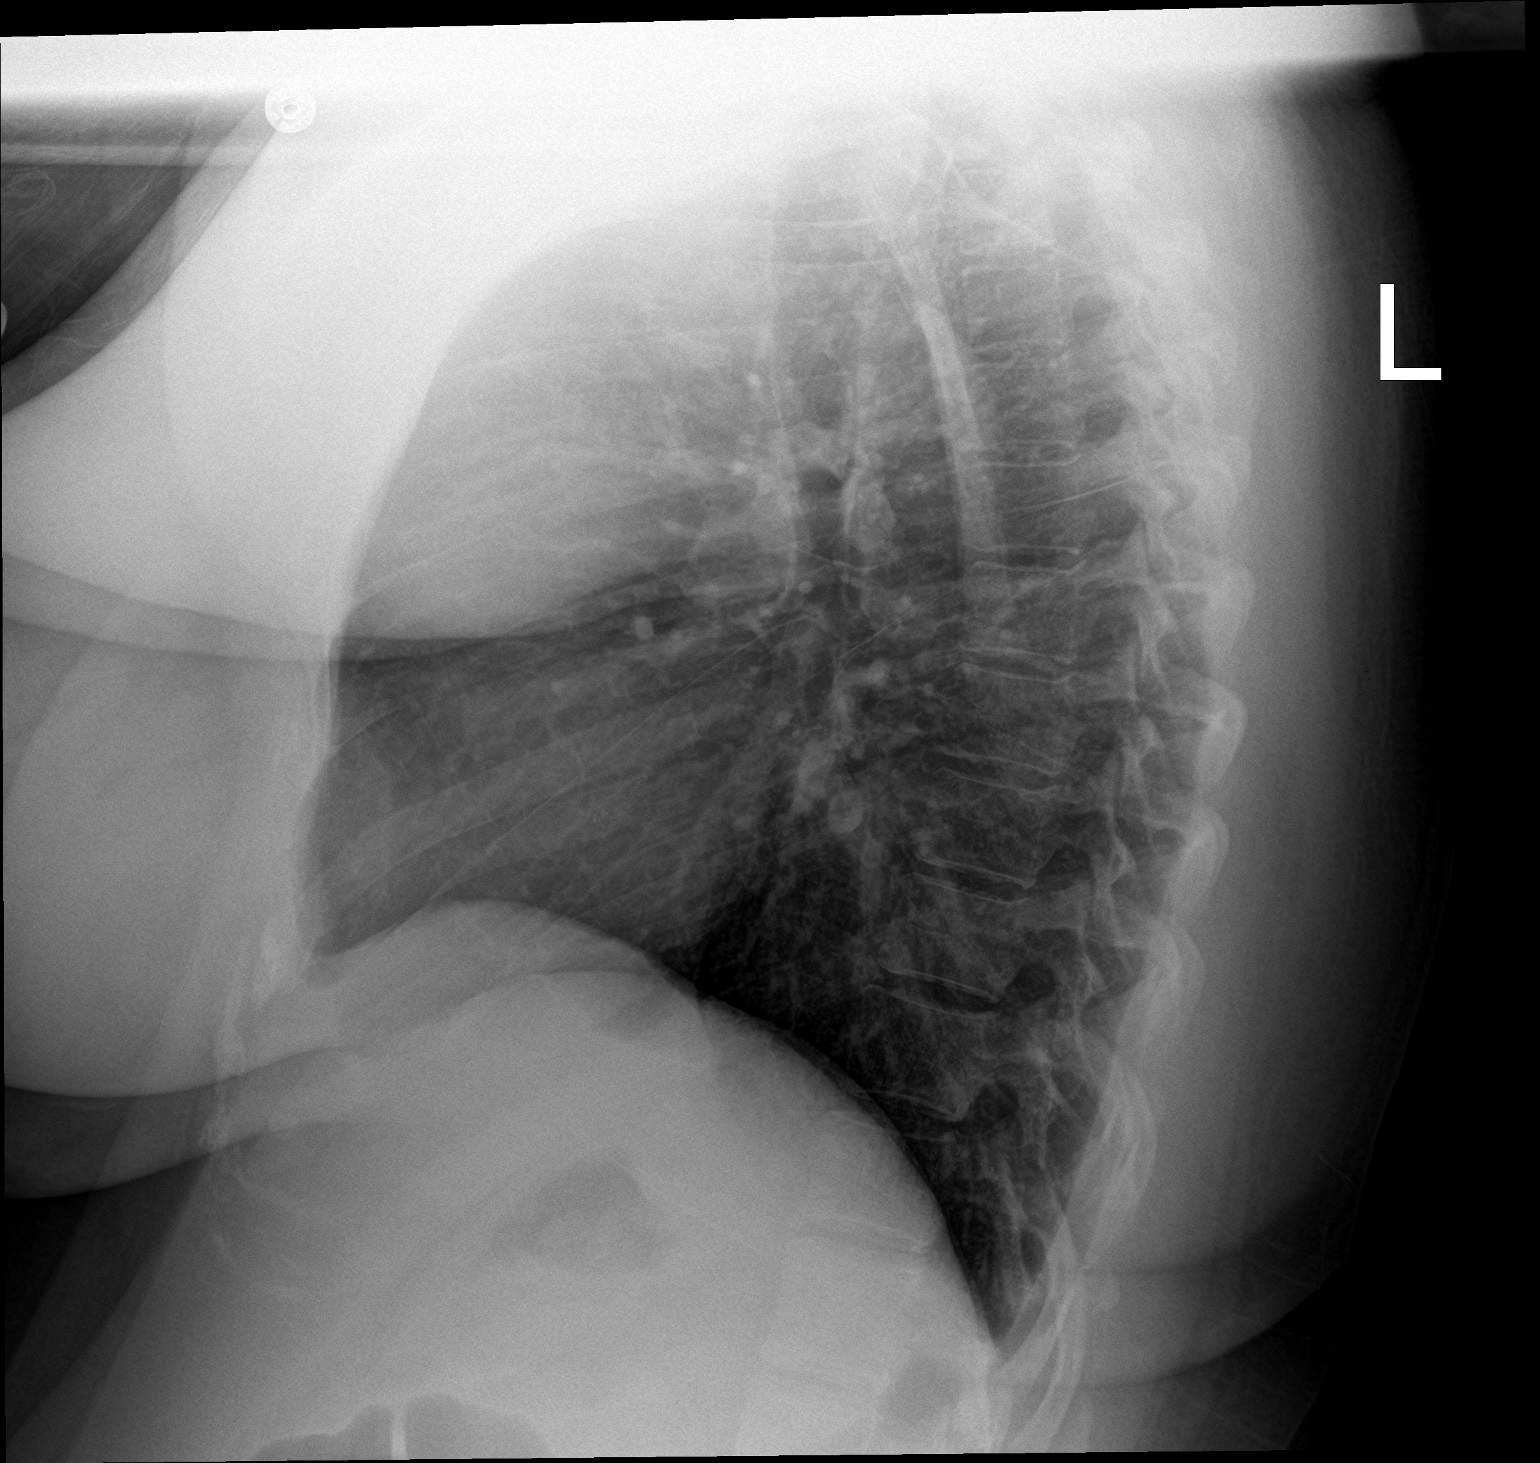

[2 of 2 positions shown; findings below may reference images not displayed]

FINDINGS: There are hazy airspace opacities at the right lung base and in the
right upper lung zone. There is no pneumothorax. There is no
significant pleural effusion. The heart size is normal. There is no
acute osseous abnormality.
IMPRESSION: Hazy airspace opacities in the right upper lung zone and right lung
base concerning for pneumonia in the appropriate clinical setting.
